# Patient Record
Sex: Male | Born: 1945 | Race: White | Hispanic: No | Marital: Married | Smoking: Never smoker
Health system: Southern US, Community
[De-identification: ages and names within clinical notes are randomized; demographics above are authoritative.]

## PROBLEM LIST (undated history)

## (undated) DIAGNOSIS — C9000 Multiple myeloma not having achieved remission: Secondary | ICD-10-CM

## (undated) DIAGNOSIS — R350 Frequency of micturition: Secondary | ICD-10-CM

## (undated) DIAGNOSIS — D497 Neoplasm of unspecified behavior of endocrine glands and other parts of nervous system: Secondary | ICD-10-CM

## (undated) DIAGNOSIS — E079 Disorder of thyroid, unspecified: Secondary | ICD-10-CM

## (undated) DIAGNOSIS — G4730 Sleep apnea, unspecified: Secondary | ICD-10-CM

## (undated) HISTORY — DX: Neoplasm of unspecified behavior of endocrine glands and other parts of nervous system: D49.7

## (undated) HISTORY — PX: TONSILLECTOMY: SUR1361

## (undated) HISTORY — DX: Multiple myeloma not having achieved remission: C90.00

## (undated) HISTORY — PX: APPENDECTOMY: SHX54

## (undated) HISTORY — DX: Frequency of micturition: R35.0

## (undated) HISTORY — PX: TRANSPHENOIDAL / TRANSNASAL HYPOPHYSECTOMY / RESECTION PITUITARY TUMOR: SUR1382

## (undated) HISTORY — DX: Sleep apnea, unspecified: G47.30

## (undated) HISTORY — DX: Disorder of thyroid, unspecified: E07.9

---

## 2009-03-09 ENCOUNTER — Other Ambulatory Visit: Payer: Self-pay | Admitting: Ophthalmology

## 2009-03-12 ENCOUNTER — Ambulatory Visit: Payer: Self-pay | Admitting: Ophthalmology

## 2012-02-06 HISTORY — PX: EYE SURGERY: SHX253

## 2012-02-06 HISTORY — PX: OTHER SURGICAL HISTORY: SHX169

## 2012-04-15 ENCOUNTER — Ambulatory Visit: Payer: Self-pay | Admitting: Ophthalmology

## 2012-04-15 DIAGNOSIS — I499 Cardiac arrhythmia, unspecified: Secondary | ICD-10-CM

## 2012-04-28 ENCOUNTER — Ambulatory Visit: Payer: Self-pay | Admitting: Ophthalmology

## 2014-01-06 ENCOUNTER — Ambulatory Visit: Payer: Self-pay | Admitting: Internal Medicine

## 2014-05-28 NOTE — Op Note (Signed)
PATIENT NAME:  Ronald Vincent, Ronald Vincent MR#:  [id number] DATE OF BIRTH:  28-Aug-1945  DATE OF SURGERY:  04/28/2012   PREOPERATIVE DIAGNOSIS: Cataract, right eye.   POSTOPERATIVE DIAGNOSIS:  Cataract, right eye.   PROCEDURE PERFORMED:  Extracapsular cataract extraction using phacoemulsification with placement of an Alcon [id number] 19.5 diopter posterior chamber lens, serial #[id number].   SURGEON: Loura Back. Vennesa Bastedo, M.D.   ANESTHESIOLOGIST: Erin Hearing, MD  COMPLICATIONS: None.   ESTIMATED BLOOD LOSS: Less than 1 mL.   ANESTHESIA:  4% lidocaine and 0.75% Marcaine in a 50/50 mixture given as a peribulbar with 10 units/mL of Hylenex added, given as a peribulbar.   DESCRIPTION OF PROCEDURE:  The patient was brought to the operating room and given a peribulbar block.  The patient was then prepped and draped in the usual fashion.  The vertical rectus muscles were imbricated using 5-0 silk sutures.  These sutures were then clamped to the sterile drapes as bridle sutures.  A limbal peritomy was performed extending two clock hours and hemostasis was obtained with cautery.  A partial thickness scleral groove was made at the surgical limbus and dissected anteriorly in a lamellar dissection using an Alcon crescent knife.  The anterior chamber was entered superonasally with a Superblade and through the lamellar dissection with a 2.6 mm keratome.  DisCoVisc was used to replace the aqueous and a continuous tear capsulorrhexis was carried out.  Hydrodissection and hydrodelineation were carried out with balanced salt and a 27 gauge canula.  The nucleus was rotated to confirm the effectiveness of the hydrodissection.    Irrigation/aspiration was used to remove the residual cortex.  DisCoVisc was used to inflate the capsule and the internal incision was enlarged to 3 mm with the crescent knife.  The intraocular lens was folded and inserted into the capsular bag.  Irrigation/aspiration was used to remove the  residual DisCoVisc.  Miostat was injected into the anterior chamber through the paracentesis track to inflate the anterior chamber and induce miosis.  Cefuroxime 0.1 mL was injected via the paracentesis tract.  The wound was checked for leaks and none were found. The conjunctiva was closed with cautery and the bridle sutures were removed.  Two drops of 0.3% Vigamox were placed on the eye.   An eye shield was placed on the eye.  Ultrasound time 59 seconds, with an average power of 22.8%, CDE of 23.25. A suture was placed and an AcrySert delivery system was used instead of a Librarian, academic.    The patient was discharged to the recovery room in good condition.    ____________________________ Loura Back Elnore Cosens, MD sad:dm D: 04/28/2012 14:19:00 ET T: 04/28/2012 15:10:22 ET JOB#: [id number]  cc: Remo Lipps A. Helga Asbury, MD, <Dictator>  Martie Lee MD ELECTRONICALLY SIGNED 05/05/2012 13:40

## 2015-06-21 ENCOUNTER — Ambulatory Visit
Admission: RE | Admit: 2015-06-21 | Discharge: 2015-06-21 | Disposition: A | Discharge status: Home / Self Care | Payer: Commercial Managed Care - PPO | Source: Ambulatory Visit | Attending: Internal Medicine | Admitting: Internal Medicine

## 2015-06-21 ENCOUNTER — Inpatient Hospital Stay: Payer: Commercial Managed Care - PPO | Attending: Internal Medicine | Admitting: Internal Medicine

## 2015-06-21 ENCOUNTER — Inpatient Hospital Stay: Payer: Commercial Managed Care - PPO

## 2015-06-21 ENCOUNTER — Encounter: Payer: Self-pay | Admitting: Internal Medicine

## 2015-06-21 VITALS — BP 122/81 | HR 72 | Temp 98.4°F | Resp 18 | Wt 165.3 lb

## 2015-06-21 DIAGNOSIS — M40202 Unspecified kyphosis, cervical region: Secondary | ICD-10-CM | POA: Insufficient documentation

## 2015-06-21 DIAGNOSIS — D696 Thrombocytopenia, unspecified: Secondary | ICD-10-CM | POA: Insufficient documentation

## 2015-06-21 DIAGNOSIS — G4730 Sleep apnea, unspecified: Secondary | ICD-10-CM | POA: Diagnosis not present

## 2015-06-21 DIAGNOSIS — R5383 Other fatigue: Secondary | ICD-10-CM | POA: Diagnosis not present

## 2015-06-21 DIAGNOSIS — E230 Hypopituitarism: Secondary | ICD-10-CM | POA: Insufficient documentation

## 2015-06-21 DIAGNOSIS — M160 Bilateral primary osteoarthritis of hip: Secondary | ICD-10-CM | POA: Insufficient documentation

## 2015-06-21 DIAGNOSIS — D649 Anemia, unspecified: Secondary | ICD-10-CM | POA: Insufficient documentation

## 2015-06-21 DIAGNOSIS — E039 Hypothyroidism, unspecified: Secondary | ICD-10-CM | POA: Insufficient documentation

## 2015-06-21 DIAGNOSIS — Z87828 Personal history of other (healed) physical injury and trauma: Secondary | ICD-10-CM | POA: Insufficient documentation

## 2015-06-21 DIAGNOSIS — C9000 Multiple myeloma not having achieved remission: Secondary | ICD-10-CM | POA: Diagnosis not present

## 2015-06-21 DIAGNOSIS — R0602 Shortness of breath: Secondary | ICD-10-CM | POA: Insufficient documentation

## 2015-06-21 DIAGNOSIS — Z79899 Other long term (current) drug therapy: Secondary | ICD-10-CM | POA: Insufficient documentation

## 2015-06-21 DIAGNOSIS — R350 Frequency of micturition: Secondary | ICD-10-CM | POA: Diagnosis not present

## 2015-06-21 DIAGNOSIS — D472 Monoclonal gammopathy: Secondary | ICD-10-CM | POA: Diagnosis present

## 2015-06-21 DIAGNOSIS — E2740 Unspecified adrenocortical insufficiency: Secondary | ICD-10-CM | POA: Diagnosis not present

## 2015-06-21 LAB — CBC WITH DIFFERENTIAL/PLATELET
Basophils Absolute: 0.2 10*3/uL — ABNORMAL HIGH (ref 0–0.1)
EOS ABS: 0.2 10*3/uL (ref 0–0.7)
Eosinophils Relative: 2 %
HCT: 38.6 % — ABNORMAL LOW (ref 40.0–52.0)
HEMOGLOBIN: 12.4 g/dL — AB (ref 13.0–18.0)
Lymphocytes Relative: 13 %
Lymphs Abs: 1.2 10*3/uL (ref 1.0–3.6)
MCH: 27.7 pg (ref 26.0–34.0)
MCHC: 32.1 g/dL (ref 32.0–36.0)
MCV: 86.4 fL (ref 80.0–100.0)
Monocytes Absolute: 0.8 10*3/uL (ref 0.2–1.0)
Monocytes Relative: 9 %
Neutro Abs: 6.8 10*3/uL — ABNORMAL HIGH (ref 1.4–6.5)
PLATELETS: 109 10*3/uL — AB (ref 150–440)
RBC: 4.47 MIL/uL (ref 4.40–5.90)
RDW: 25.4 % — ABNORMAL HIGH (ref 11.5–14.5)
WBC: 9.1 10*3/uL (ref 3.8–10.6)

## 2015-06-21 LAB — LACTATE DEHYDROGENASE: LDH: 158 U/L (ref 98–192)

## 2015-06-21 NOTE — Progress Notes (Signed)
Patient here today as new evaluation regarding elevated WBC's.  Multiple Myeloma)  Referred by Dr. Ouida Sills.

## 2015-06-21 NOTE — Progress Notes (Signed)
[facility] CONSULT NOTE  No care team member to display  CHIEF COMPLAINTS/PURPOSE OF CONSULTATION:   # MAY 2017- M- spike 1.8gm/dl [IgGKappa-IFE] [creat-1.0/ca- 8.8]; Hb12/platelets- 115.   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 CBC showed mild anemia hemoglobin 12; platelets 1:15. Patient's iron studies B12 normal limits. Further workup was remarkable for- M protein 1.8 g/dL/ he has been referred to [place] for further evaluation and recommendations.  Patient complains of worsening fatigue in the last 3-4 months. Shortness of breath on exertion. Denies any shortness of breath at rest. Denies any unusual cough. Denies any swelling in the legs or weight loss or tingling and numbness. Denies any chest pain. Occasional leg cramps. Patient is fairly active for his age.   ROS: A complete 10 point review of system is done which is negative except mentioned above in history of present illness  MEDICAL HISTORY:  Past Medical History  Diagnosis Date  . 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 Sleep apnea   . Thyroid dysfunction   . Urinary frequency   . Multiple myeloma ([place])     SURGICAL HISTORY: Past Surgical History  Procedure Laterality Date  . Appendectomy      SOCIAL HISTORY: No smoking or alcohol. [place] here. His daughter is a Marine scientist in the [employer]. He lives with his wife at home. Social History   Social History  . Marital Status: Married    Spouse Name: N/A  . Number of Children: N/A  . Years of Education: N/A   Occupational History  . Not on file.   Social  History Main Topics  . Smoking status: Never Smoker   . Smokeless tobacco: Never Used  . Alcohol Use: No  . Drug Use: No  . Sexual Activity: Not on file   Other Topics Concern  . Not on file   Social History Narrative  . No narrative on file    FAMILY HISTORY:No family history of multiple myeloma. No family history on file.  ALLERGIES:  has No Known Allergies.  MEDICATIONS:  Current Outpatient Prescriptions  Medication Sig Dispense Refill  . alendronate (FOSAMAX) 70 MG tablet Take by mouth.    . B Complex Vitamins (VITAMIN-B COMPLEX) TABS Take by mouth.    . Flaxseed, Linseed, (FLAXSEED OIL) 1000 MG CAPS Take by mouth.    . Glucosamine Sulfate 500 MG TABS Take by mouth.    . hydrocortisone (CORTEF) 10 MG tablet     . levothyroxine (SYNTHROID, LEVOTHROID) 150 MCG tablet     . Multiple Vitamin (MULTI-VITAMINS) TABS Take by mouth.    Marland Kitchen NEEDLE, DISP, 18 G (BD ECLIPSE SHIELDED NEEDLE) 18G X 1-1/2" MISC     . Syringe, Disposable, (2-3CC SYRINGE) 3 ML MISC     . SYRINGE-NEEDLE, DISP, 3 ML (B-D 3CC LUER-LOK SYR 18GX1-1/2) 18G X 1-1/2" 3 ML MISC     . testosterone cypionate (DEPOTESTOSTERONE CYPIONATE) 200 MG/ML injection     . B-D 3CC LUER-LOK SYR 18GX1-1/2 18G X 1-1/2" 3 ML MISC USE 1 SYRINGE ONCE EVERY 2 WEEKS  2  . Cyanocobalamin (B-12) 2500 MCG TABS Take  by mouth.     No current facility-administered medications for this visit.      Marland Kitchen  PHYSICAL EXAMINATION: ECOG PERFORMANCE STATUS: 0 - Asymptomatic  Filed Vitals:   06/21/15 1107  BP: 122/81  Pulse: 72  Temp: 98.4 F (36.9 C)  Resp: 18   Filed Weights   06/21/15 1107  Weight: 165 lb 5.5 oz (75 kg)    GENERAL: Well-nourished well-developed; Alert, no distress and comfortable. Alone.  EYES: no pallor or icterus OROPHARYNX: no thrush or ulceration; good dentition  NECK: supple, no masses felt; flexion deformity of the neck LYMPH:  no palpable lymphadenopathy in the cervical, axillary or inguinal regions LUNGS:  clear to auscultation and  No wheeze or crackles HEART/CVS: regular rate & rhythm and no murmurs; No lower extremity edema ABDOMEN: abdomen soft, non-tender and normal bowel sounds Musculoskeletal:no cyanosis of digits and no clubbing  PSYCH: alert & oriented x 3 with fluent speech NEURO: no focal motor/sensory deficits SKIN:  no rashes or significant lesions  LABORATORY DATA:  I have reviewed the data as listed No results found for: WBC, HGB, HCT, MCV, PLT No results for input(s): NA, K, CL, CO2, GLUCOSE, BUN, CREATININE, CALCIUM, GFRNONAA, GFRAA, PROT, ALBUMIN, AST, ALT, ALKPHOS, BILITOT, BILIDIR, IBILI in the last 8760 hours.  RADIOGRAPHIC STUDIES: I have personally reviewed the radiological images as listed and agreed with the findings in the report. No results found.  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 Otherwise no renal insufficiency/hypercalcemia. Otherwise clinically doing well- except for fatigue.  I had a long discussion the patient and family regarding- pathophysiology of multiple myeloma/monoclonal gammopathy. I would recommend rechecking CBC And lambda light chain ratio; metastatic bone/skeletal survey. I also discussed regarding bone marrow biopsy- which will likely need to be done to evaluate for monoclonal plasma cells. The timing of the bone marrow will depend upon patient's labs from today- if his kappa/lambda light chain ratio- is extremely abnormal/orders of the labs are abnormal.   # Mild thrombocytopenia platelet count 1:15- unclear etiology question related to M protein versus other causes.   # If patient's labs the steady- I would recommend repeating the labs in approximately 3 months/ reevaluate for bone marrow biopsy at that time.  # Fatigue unclear etiology- patient is being worked up for cardiac causes; low testosterone- on testosterone  supplementation.Defer to endocrinology.   Thank you Dr. Ouida Sills for allowing me to participate in the care of your pleasant patient. Please do not hesitate to contact me with questions or concerns in the interim.     Cammie Sickle, MD 06/21/2015 12:05 PM

## 2015-06-22 ENCOUNTER — Telehealth: Payer: Self-pay | Admitting: Internal Medicine

## 2015-06-22 LAB — KAPPA/LAMBDA LIGHT CHAINS
KAPPA FREE LGHT CHN: 113.15 mg/L — AB (ref 3.30–19.40)
Kappa, lambda light chain ratio: 17.22 — ABNORMAL HIGH (ref 0.26–1.65)
Lambda free light chains: 6.57 mg/L (ref 5.71–26.30)

## 2015-06-22 NOTE — Telephone Encounter (Signed)
Left a message for the patient to call us back. I would recommend bone marrow biopsy for further evaluation of his M protein/ elevated kappa- Lambda light chain ratio.   [place]- we saw him in [place] yesterday.

## 2015-06-23 ENCOUNTER — Other Ambulatory Visit: Payer: Self-pay | Admitting: Internal Medicine

## 2015-06-23 ENCOUNTER — Other Ambulatory Visit: Payer: Self-pay | Admitting: *Deleted

## 2015-06-23 DIAGNOSIS — D472 Monoclonal gammopathy: Secondary | ICD-10-CM

## 2015-06-23 NOTE — Telephone Encounter (Signed)
Pt returned Dr. Aletha Halim phone call. He is agreeable for bone marrow biopsy. Order placed in chl and msg sent to scheduling to arrange. Pt will be contacted with an bone marrow biopsy appointment.  Md will see patient back a week s/p bone marrow bx for results.

## 2015-06-24 NOTE — Telephone Encounter (Signed)
Pt will go to [place] to have bx performed. Unable to accommodate pt at [facility] due to ct image machine down and in maintenance.

## 2015-06-28 ENCOUNTER — Other Ambulatory Visit: Payer: Self-pay | Admitting: Physician Assistant

## 2015-06-28 ENCOUNTER — Other Ambulatory Visit: Payer: Self-pay | Admitting: Radiology

## 2015-06-28 ENCOUNTER — Other Ambulatory Visit: Payer: Self-pay | Admitting: General Surgery

## 2015-06-29 ENCOUNTER — Ambulatory Visit (HOSPITAL_COMMUNITY)
Admission: RE | Admit: 2015-06-29 | Discharge: 2015-06-29 | Disposition: A | Discharge status: Home / Self Care | Payer: Commercial Managed Care - PPO | Source: Ambulatory Visit | Attending: Internal Medicine | Admitting: Internal Medicine

## 2015-06-29 ENCOUNTER — Encounter (HOSPITAL_COMMUNITY): Payer: Self-pay

## 2015-06-29 DIAGNOSIS — E079 Disorder of thyroid, unspecified: Secondary | ICD-10-CM | POA: Insufficient documentation

## 2015-06-29 DIAGNOSIS — Z7983 Long term (current) use of bisphosphonates: Secondary | ICD-10-CM | POA: Insufficient documentation

## 2015-06-29 DIAGNOSIS — D649 Anemia, unspecified: Secondary | ICD-10-CM | POA: Insufficient documentation

## 2015-06-29 DIAGNOSIS — D696 Thrombocytopenia, unspecified: Secondary | ICD-10-CM | POA: Insufficient documentation

## 2015-06-29 DIAGNOSIS — D472 Monoclonal gammopathy: Secondary | ICD-10-CM | POA: Diagnosis not present

## 2015-06-29 DIAGNOSIS — Z79899 Other long term (current) drug therapy: Secondary | ICD-10-CM | POA: Diagnosis not present

## 2015-06-29 DIAGNOSIS — G4730 Sleep apnea, unspecified: Secondary | ICD-10-CM | POA: Diagnosis not present

## 2015-06-29 DIAGNOSIS — E230 Hypopituitarism: Secondary | ICD-10-CM | POA: Diagnosis not present

## 2015-06-29 HISTORY — PX: BONE MARROW BIOPSY: SHX199

## 2015-06-29 LAB — PROTIME-INR
INR: 1.08 (ref 0.00–1.49)
PROTHROMBIN TIME: 14.2 s (ref 11.6–15.2)

## 2015-06-29 LAB — CBC
HCT: 37.5 % — ABNORMAL LOW (ref 39.0–52.0)
HEMOGLOBIN: 12.6 g/dL — AB (ref 13.0–17.0)
MCH: 27.9 pg (ref 26.0–34.0)
MCHC: 33.6 g/dL (ref 30.0–36.0)
MCV: 83.1 fL (ref 78.0–100.0)
PLATELETS: 101 10*3/uL — AB (ref 150–400)
RBC: 4.51 MIL/uL (ref 4.22–5.81)
RDW: 23.1 % — ABNORMAL HIGH (ref 11.5–15.5)
WBC: 7.3 10*3/uL (ref 4.0–10.5)

## 2015-06-29 LAB — APTT: aPTT: 26 seconds (ref 24–37)

## 2015-06-29 LAB — BONE MARROW EXAM

## 2015-06-29 MED ORDER — MIDAZOLAM HCL 2 MG/2ML IJ SOLN
INTRAMUSCULAR | Status: AC
  Filled 2015-06-29: qty 4

## 2015-06-29 MED ORDER — HYDROCODONE-ACETAMINOPHEN 5-325 MG PO TABS
1.0000 | ORAL_TABLET | ORAL | Status: DC | PRN
  Filled 2015-06-29: qty 2

## 2015-06-29 MED ORDER — FENTANYL CITRATE (PF) 100 MCG/2ML IJ SOLN
INTRAMUSCULAR | Status: AC
  Filled 2015-06-29: qty 2

## 2015-06-29 MED ORDER — FENTANYL CITRATE (PF) 100 MCG/2ML IJ SOLN
INTRAMUSCULAR | Status: AC | PRN
  Administered 2015-06-29 (×2): 50 ug via INTRAVENOUS

## 2015-06-29 MED ORDER — SODIUM CHLORIDE 0.9 % IV SOLN
INTRAVENOUS | Status: DC
  Administered 2015-06-29: 07:00:00 via INTRAVENOUS

## 2015-06-29 MED ORDER — MIDAZOLAM HCL 2 MG/2ML IJ SOLN
INTRAMUSCULAR | Status: AC | PRN
  Administered 2015-06-29 (×2): 1 mg via INTRAVENOUS

## 2015-06-29 NOTE — Procedures (Signed)
Interventional Radiology Procedure Note  Procedure: CT guided aspirate and core biopsy of right iliac bone Complications: None Recommendations: - Bedrest supine x 1 hrs - Hydrocodone PRN  Pain - Follow biopsy results  Signed,  Heath K. McCullough, MD   

## 2015-06-29 NOTE — Discharge Instructions (Signed)
Bone Marrow Aspiration and Bone Marrow Biopsy, Care After Refer to this sheet in the next few weeks. These instructions provide you with information about caring for yourself after your procedure. Your health care provider may also give you more specific instructions. Your treatment has been planned according to current medical practices, but problems sometimes occur. Call your health care provider if you have any problems or questions after your procedure. WHAT TO EXPECT AFTER THE PROCEDURE After your procedure, it is common to have:  Soreness or tenderness around the puncture site.  Bruising. HOME CARE INSTRUCTIONS  Take medicines only as directed by your health care provider.  Follow your health care provider's instructions about:  Puncture site care.  Bandage (dressing) changes and removal.  Bathe and shower as directed by your health care provider.  Check your puncture site every day for signs of infection. Watch for:  Redness, swelling, or pain.  Fluid, blood, or pus.  Return to your normal activities as directed by your health care provider.  Keep all follow-up visits as directed by your health care provider. This is important. SEEK MEDICAL CARE IF:  You have a fever.  You have uncontrollable bleeding.  You have redness, swelling, or pain at the site of your puncture.  You have fluid, blood, or pus coming from your puncture site.   This information is not intended to replace advice given to you by your health care provider. Make sure you discuss any questions you have with your health care provider.   Document Released: 08/11/2004 Document Revised: 06/08/2014 Document Reviewed: 01/13/2014 Elsevier Interactive Patient Education 2016 Elsevier [employer] Moderate Conscious Sedation, Adult, Care After Refer to this sheet in the next few weeks. These instructions provide you with information on caring for yourself after your procedure. Your health care provider may also give  you more specific instructions. Your treatment has been planned according to current medical practices, but problems sometimes occur. Call your health care provider if you have any problems or questions after your procedure. WHAT TO EXPECT AFTER THE PROCEDURE  After your procedure:  You may feel sleepy, clumsy, and have poor balance for several hours.  Vomiting may occur if you eat too soon after the procedure. HOME CARE INSTRUCTIONS  Do not participate in any activities where you could become injured for at least 24 hours. Do not:  Drive.  Swim.  Ride a bicycle.  Operate heavy machinery.  Cook.  Use power tools.  Climb ladders.  Work from a high place.  Do not make important decisions or sign legal documents until you are improved.  If you vomit, drink water, juice, or soup when you can drink without vomiting. Make sure you have little or no nausea before eating solid foods.  Only take over-the-counter or prescription medicines for pain, discomfort, or fever as directed by your health care provider.  Make sure you and your family fully understand everything about the medicines given to you, including what side effects may occur.  You should not drink alcohol, take sleeping pills, or take medicines that cause drowsiness for at least 24 hours.  If you smoke, do not smoke without supervision.  If you are feeling better, you may resume normal activities 24 hours after you were sedated.  Keep all appointments with your health care provider. SEEK MEDICAL CARE IF:  Your skin is pale or bluish in color.  You continue to feel nauseous or vomit.  Your pain is getting worse and is not helped by medicine.  You have bleeding or swelling.  You are still sleepy or feeling clumsy after 24 hours. SEEK IMMEDIATE MEDICAL CARE IF:  You develop a rash.  You have difficulty breathing.  You develop any type of allergic problem.  You have a fever. MAKE SURE YOU:  Understand  these instructions.  Will watch your condition.  Will get help right away if you are not doing well or get worse.   This information is not intended to replace advice given to you by your health care provider. Make sure you discuss any questions you have with your health care provider.   Document Released: 11/12/2012 Document Revised: 02/12/2014 Document Reviewed: 11/12/2012 Elsevier Interactive Patient Education [employer].

## 2015-06-29 NOTE — Consult Note (Signed)
Chief Complaint: Patient was seen in consultation today for  CT guided bone marrow biopsy  Referring Physician(s): Cammie Sickle  Supervising Physician: Jacqulynn Cadet  Patient Status:  Out-pt  History of Present Illness: Ronald Vincent is a 70 y.o. 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 He presents today for CT-guided bone marrow biopsy for further evaluation.  Past Medical History  Diagnosis Date  . 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 Sleep apnea   . Thyroid dysfunction   . Urinary frequency   . Multiple myeloma [facility])     Past Surgical History  Procedure Laterality Date  . Appendectomy    . Transphenoidal / transnasal hypophysectomy / resection pituitary tumor  [id number]  . Cataracts Right 2014  . Eye surgery Bilateral 2014    to correct double vision  . Tonsillectomy      as a child    Allergies: Review of patient's allergies indicates no known allergies.  Medications: Prior to Admission medications   Medication Sig Start Date End Date Taking? Authorizing Provider  B Complex Vitamins (VITAMIN-B COMPLEX) TABS Take by mouth. 09/27/09  Yes Historical Provider, MD  Cyanocobalamin (B-12) 2500 MCG TABS Take by mouth.   Yes Historical Provider, MD  Flaxseed, Linseed, (FLAXSEED OIL) 1000 MG CAPS Take by mouth. 09/27/09  Yes Historical Provider, MD  Glucosamine Sulfate 500 MG TABS Take by mouth. 09/27/09  Yes Historical Provider, MD  hydrocortisone (CORTEF) 10 MG tablet  06/08/15  Yes Historical Provider, MD  levothyroxine (SYNTHROID, LEVOTHROID) 150 MCG tablet  06/13/15  Yes Historical Provider, MD  Multiple Vitamin (MULTI-VITAMINS) TABS Take by mouth. 09/27/09  Yes Historical Provider, MD  alendronate (FOSAMAX) 70 MG tablet Take by mouth. 12/14/14 12/14/15  Historical Provider, MD  B-D 3CC LUER-LOK SYR 18GX1-1/2 18G X 1-1/2" 3 ML MISC USE 1 SYRINGE  ONCE EVERY 2 WEEKS 04/07/15   Historical Provider, MD  NEEDLE, DISP, 18 G (BD ECLIPSE SHIELDED NEEDLE) 18G X 1-1/2" [place]  03/22/14   Historical Provider, MD  Syringe, Disposable, (2-3CC SYRINGE) 3 ML MISC  10/15/14   Historical Provider, MD  SYRINGE-NEEDLE, DISP, 3 ML (B-D 3CC LUER-LOK SYR 18GX1-1/2) 18G X 1-1/2" 3 ML MISC  02/16/15   Historical Provider, MD  testosterone cypionate (DEPOTESTOSTERONE CYPIONATE) 200 MG/ML injection  12/14/14   Historical Provider, MD     History reviewed. No pertinent family history.  Social History   Social History  . Marital Status: Married    Spouse Name: N/A  . Number of Children: N/A  . Years of Education: N/A   Social History Main Topics  . Smoking status: Never Smoker   . Smokeless tobacco: Never Used  . Alcohol Use: No  . Drug Use: No  . Sexual Activity: Not Asked   Other Topics Concern  . None   Social History Narrative      Review of Systems  Constitutional: Positive for fatigue. Negative for fever and chills.  Respiratory: Negative for cough.        Some dyspnea with exertion  Cardiovascular: Negative for chest pain.  Gastrointestinal: Negative for nausea, vomiting, abdominal pain and blood in stool.  Genitourinary: Negative for dysuria and hematuria.  Musculoskeletal: Negative for back pain.  Neurological: Negative for headaches.    Vital Signs: BP 124/84 mmHg  Pulse 75  Temp(Src) 97.7 F (36.5 C) (Oral)  Resp 18  Ht 5' 5"  (1.651 m)  Wt 165 lb 5.5 oz (74.999 kg)  BMI 27.51 kg/m2  SpO2 100%  Physical Exam  Constitutional: He is oriented to person, place, and time. He appears well-developed and well-nourished.  Cardiovascular: Normal rate and regular rhythm.   Pulmonary/Chest: Effort normal and breath sounds normal.  Abdominal: Soft. Bowel sounds are normal. There is no tenderness.  Musculoskeletal: Normal range of motion. He exhibits no edema.  Neurological: He is alert and oriented to person, place, and time.     Mallampati Score:     Imaging: Dg Bone Survey Met  06/21/2015  CLINICAL DATA:  History of multiple myeloma, history prior neck fracture EXAM: METASTATIC BONE SURVEY COMPARISON:  None. FINDINGS: The lateral view the skull shows a in and apparent postoperative linear defect within the frontal -temporal calvarium with some widening of the bone at the surgical site. However no radiolucent lesion is seen to indicate multiple myeloma. The cervical vertebrae are very difficult to evaluate with extreme kyphosis. There is anterolisthesis of C3 on C4 by approximately 6 mm with slight anterolisthesis of C4 on C5 by approximately 3 mm. There appears to be significant degenerative change from C3-C7 with partial compression of C4, C5, and C6 with anterior osteophyte formation. No radiolucent lesion is seen with certainty. The thoracic vertebrae are normal alignment. No compression deformity is seen. Mild anterior osteophyte formation is noted. There is minimal curvature of the lumbar vertebrae convex to the right. The bones are diffusely osteopenic. There is degenerative disc disease primarily in the lower thoracic and upper lumbar spine but no compression deformity is seen. No radiolucent lesion is noted. Probable gallstones are noted within the right upper quadrant. Views the right shoulder, right humerus, and right forearm show no radiolucent lesion. There is degenerative change in the right shoulder with some loss of joint space and acromial spurring. Also no acute rib abnormality is seen. Views of the left shoulder, left humerus, and left forearm show no radiolucent lesion. Degenerative changes are present left shoulder. A frontal chest x-ray shows no active infiltrate or effusion. Minimally prominent interstitial markings may be present. The heart is within upper limits of normal. No acute bony abnormality is seen. A view of the pelvis shows the bones to be osteopenic. No radiolucent lesion is seen. Only mild  degenerative joint disease of the hips is present. Views of the right femur and right tibia and fibula show no radiolucent lesion. Views of the left femur, left tibia and fibula show no radiolucent abnormality IMPRESSION: 1. No radiolucent lesions are seen to indicate myelomatous deposits. 2. Probable postoperative linear defect in the frontotemporal calvarium as noted above. 3. 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 4. Probable gallstones in the right upper quadrant. 5. No active lung disease. Question mild chronic interstitial change. Electronically Signed   By: Ivar Drape M.D.   On: 06/21/2015 14:16    Labs:  CBC:  Recent Labs  06/21/15 1158 06/29/15 0658  WBC 9.1 7.3  HGB 12.4* 12.6*  HCT 38.6* 37.5*  PLT 109* 101*    COAGS:  Recent Labs  06/29/15 0658  INR 1.08  APTT 26    BMP: No results for input(s): NA, K, CL, CO2, GLUCOSE, BUN, CALCIUM, CREATININE, GFRNONAA, GFRAA in the last 8760 hours.  Invalid input(s): CMP  LIVER FUNCTION TESTS: No results for input(s): BILITOT, AST, ALT, ALKPHOS, PROT, ALBUMIN in the last 8760 hours.  TUMOR  MARKERS: No results for input(s): AFPTM, CEA, CA199, CHROMGRNA in the last 8760 hours.  Assessment and Plan: 70 y.o. 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 He presents today for CT-guided bone marrow biopsy for further evaluation.Risks and benefits discussed with the patient/wife including, but not limited to bleeding, infection, damage to adjacent structures or low yield requiring additional tests.All of the patient's questions were answered, patient is agreeable to proceed.Consent signed and in chart.     Thank you for this interesting consult.  I greatly enjoyed meeting BRIA PORTALES and  look forward to participating in their care.  A copy of this report was sent to the requesting provider on this date.  Electronically Signed: D. Rowe Robert 06/29/2015, 8:28 AM   I spent a total of 20 minutes in face to face in clinical consultation, greater than 50% of which was counseling/coordinating care for CT-guided bone marrow biopsy

## 2015-07-01 ENCOUNTER — Other Ambulatory Visit: Payer: Self-pay | Admitting: Internal Medicine

## 2015-07-05 ENCOUNTER — Encounter: Payer: Self-pay | Admitting: Internal Medicine

## 2015-07-05 ENCOUNTER — Inpatient Hospital Stay (HOSPITAL_BASED_OUTPATIENT_CLINIC_OR_DEPARTMENT_OTHER): Payer: Commercial Managed Care - PPO | Admitting: Internal Medicine

## 2015-07-05 VITALS — BP 117/80 | HR 66 | Temp 98.3°F | Resp 18 | Wt 164.7 lb

## 2015-07-05 DIAGNOSIS — C9000 Multiple myeloma not having achieved remission: Secondary | ICD-10-CM | POA: Insufficient documentation

## 2015-07-05 DIAGNOSIS — Z79899 Other long term (current) drug therapy: Secondary | ICD-10-CM

## 2015-07-05 DIAGNOSIS — D696 Thrombocytopenia, unspecified: Secondary | ICD-10-CM | POA: Diagnosis not present

## 2015-07-05 DIAGNOSIS — M160 Bilateral primary osteoarthritis of hip: Secondary | ICD-10-CM

## 2015-07-05 DIAGNOSIS — Z87828 Personal history of other (healed) physical injury and trauma: Secondary | ICD-10-CM

## 2015-07-05 DIAGNOSIS — E039 Hypothyroidism, unspecified: Secondary | ICD-10-CM

## 2015-07-05 DIAGNOSIS — D472 Monoclonal gammopathy: Secondary | ICD-10-CM | POA: Diagnosis not present

## 2015-07-05 DIAGNOSIS — R5383 Other fatigue: Secondary | ICD-10-CM

## 2015-07-05 DIAGNOSIS — R0602 Shortness of breath: Secondary | ICD-10-CM

## 2015-07-05 DIAGNOSIS — G4730 Sleep apnea, unspecified: Secondary | ICD-10-CM

## 2015-07-05 DIAGNOSIS — E230 Hypopituitarism: Secondary | ICD-10-CM | POA: Diagnosis not present

## 2015-07-05 DIAGNOSIS — E2740 Unspecified adrenocortical insufficiency: Secondary | ICD-10-CM

## 2015-07-05 DIAGNOSIS — D649 Anemia, unspecified: Secondary | ICD-10-CM

## 2015-07-05 DIAGNOSIS — R350 Frequency of micturition: Secondary | ICD-10-CM

## 2015-07-05 NOTE — Progress Notes (Signed)
Patient here today for bone marrow biopsy results. 

## 2015-07-05 NOTE — Patient Instructions (Signed)
Multiple Myeloma Multiple myeloma is a form of cancer that results from the uncontrolled growth of abnormal plasma cells. Plasma cells are a type of white blood cell produced in the soft tissue inside bones (bone marrow). These are cells in your blood that normally help you fight infection. They are part of your body's defense system (immune system). Plasma cells that become cancerous will grow out of control. As a result, they interfere with normal blood cells and many important functions that normal cells perform in your body. With multiple myeloma, the abnormal plasma cells cause multiple tumors to form. Multiple myeloma damages your bones and causes other health problems because of its effect on blood cells. The disease progresses and reduces your ability to fight off infections. CAUSES The cause of multiple myeloma is not known. RISK FACTORS Risk factors include:  Being older than 3.  Being African American.  Having a family history of the disease. SIGNS AND SYMPTOMS Signs and symptoms of multiple myeloma may include:  Bone pain, especially in the back, ribs, and hips.  Broken bones (fractures).  Low blood counts, including reduced red blood cells (anemia), reduced white blood cells (leukopenia), and reduced platelets.  Fatigue.  Weakness.  Infections.  Bleeding, such as bleeding from the nose or gums, or increased bleeding from a scrape or cut.  High blood calcium levels.  Increased urination.  Confusion. DIAGNOSIS Your health care provider will do a physical exam and take your medical history. Tests will be done to help confirm the diagnosis. Tests may include:  Blood tests.  Urine tests.  X-rays.  MRI.  Bone marrow biopsy. In this test, a sample of marrow is removed from one of your bones. The sample is viewed under a microscope to check for abnormal plasma cells. TREATMENT There is no cure for multiple myeloma. Treatment options may vary depending on how much  the disease has advanced. Possible treatment options may include:  Medicines that kill cancer cells (chemotherapy).  Medicines that help prevent bone damage (bisphosphonates).  Radiation therapy. High-energy rays are used to kill cancer cells.  Surgery. This may be done to repair damage to bone.  Targeted drug therapy. These medicines block the growth and spread of cancer cells.  Immunotherapy. This is also called biologic therapy. It involves the use of medicines to strengthen the ability of your immune system to fight cancer cells.  Stem cell transplant. Healthy stem cells are infused into your body. These stem cells produce new blood cells to replace those killed by the disease or by other treatments. The healthy cells that are transplanted may be your own or may come from another person.  Plasmapheresis. This is a procedure used to remove plasma cells from your blood.  Other medicines to treat problems such as infections or pain. HOME CARE INSTRUCTIONS  Take medicines only as directed by your health care provider.  Drink enough fluid to keep your urine clear or pale yellow.  Eat a well-balanced diet. Work with a dietitian to make sure you are getting the nutrition you need.  Take vitamins or dietary supplements as directed by your health care provider or dietitian.  Stay active. Talk to your health care provider about what types of exercises and activities are safe for you.  Avoid activities that cause increased pain.  Do not lift anything heavier than 10 lb (4.5 kg).  Consider joining a support group or seeking counseling to help you cope with the stress of having multiple myeloma.  Keep all  follow-up visits as directed by your health care provider. This is important. SEEK MEDICAL CARE IF:  Your pain is not controlled with medicine or is getting worse.  You have a fever.  You have swollen legs.  You have weakness or dizziness.  You have unexplained weight  loss.  You have unexplained bleeding or bruising.  You have a cough or cold symptoms.  You feel depressed.  You have changes in urination or bowel movements. SEEK IMMEDIATE MEDICAL CARE IF:  You have sudden severe pain, especially back pain.  You have numbness or weakness in your arms, hands, legs, or feet.  You have confusion.  You have weakness on one side of your body.  You have slurred speech.  You have trouble staying awake.  You have shortness of breath.  You have blood in your stool or urine.  You vomit or cough up blood.   This information is not intended to replace advice given to you by your health care provider. Make sure you discuss any questions you have with your health care provider.   Document Released: 10/17/2000 Document Revised: 02/12/2014 Document Reviewed: 08/25/2013 Elsevier Interactive Patient Education [employer].

## 2015-07-05 NOTE — Progress Notes (Signed)
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 FISH- pending.   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  male pleasant Caucasian patient noted to have elevated M protein/thrombocytopenia-summarized above is here to review the results of the bone marrow biopsy.  Bone marrow biopsy procedure was uneventful. No bleeding. No significant pain currently.  Patient continues to have chronic fatigue; not significantly improved. Chronic mild Shortness of breath on exertion. Denies any shortness of breath at rest. Denies any unusual cough. Denies any swelling in the legs or weight loss or tingling and numbness. Denies any chest pain.Patient is fairly active for his age.  ROS: A complete 10 point review of system is done which is negative except mentioned above in history of present illness  MEDICAL HISTORY:  Past Medical History  Diagnosis Date  . 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 Sleep apnea   . Thyroid dysfunction   . Urinary frequency   . Multiple myeloma ([place])     smuldering multiple myeloma    SURGICAL HISTORY: Past Surgical History  Procedure Laterality Date  . Appendectomy    . Transphenoidal / transnasal hypophysectomy / resection pituitary tumor  [id number],2376  . Cataracts Right 2014  . Eye surgery Bilateral 2014    to correct double vision  . Tonsillectomy      as a child  . Bone marrow biopsy  06/29/15    SOCIAL HISTORY: No  smoking or alcohol. [place] here. His daughter is a Marine scientist in the [employer]. He lives with his wife at home. Social History   Social History  . Marital Status: Married    Spouse Name: N/A  . Number of Children: N/A  . Years of Education: N/A   Occupational History  . Not on file.   Social History Main Topics  . Smoking status: Never Smoker   . Smokeless tobacco: Never Used  . Alcohol Use: No  . Drug Use: No  . Sexual Activity: Not on file   Other Topics Concern  . Not on file   Social History Narrative    FAMILY HISTORY:No family history of multiple myeloma. No family history on file.  ALLERGIES:  has No Known Allergies.  MEDICATIONS:  Current Outpatient Prescriptions  Medication Sig Dispense Refill  . alendronate (FOSAMAX) 70 MG tablet Take by mouth.    . B Complex Vitamins (VITAMIN-B COMPLEX) TABS Take by mouth.    . B-D 3CC LUER-LOK SYR 18GX1-1/2 18G X 1-1/2" 3 ML MISC USE 1 SYRINGE ONCE EVERY 2 WEEKS  2  . Cyanocobalamin (B-12) 2500 MCG TABS Take by mouth.    . Flaxseed, Linseed, (FLAXSEED OIL) 1000 MG CAPS Take by mouth.    . Glucosamine Sulfate 500 MG TABS Take by mouth.    . hydrocortisone (CORTEF) 10 MG tablet     . levothyroxine (SYNTHROID, LEVOTHROID) 150 MCG tablet     . Multiple Vitamin (MULTI-VITAMINS) TABS Take by mouth.    Marland Kitchen  NEEDLE, DISP, 18 G (BD ECLIPSE SHIELDED NEEDLE) 18G X 1-1/2" MISC     . Syringe, Disposable, (2-3CC SYRINGE) 3 ML MISC     . SYRINGE-NEEDLE, DISP, 3 ML (B-D 3CC LUER-LOK SYR 18GX1-1/2) 18G X 1-1/2" 3 ML MISC     . testosterone cypionate (DEPOTESTOSTERONE CYPIONATE) 200 MG/ML injection      No current facility-administered medications for this visit.      Marland Kitchen  PHYSICAL EXAMINATION: ECOG PERFORMANCE STATUS: 0 - Asymptomatic  Filed Vitals:   07/05/15 1147  BP: 117/80  Pulse: 66  Temp: 98.3 F (36.8 C)  Resp: 18   Filed Weights   07/05/15 1147  Weight: 164 lb 10.9 oz (74.7 kg)     GENERAL: Well-nourished well-developed; Alert, no distress and comfortable. Accompanied by his wife. EYES: no pallor or icterus OROPHARYNX: no thrush or ulceration; good dentition  NECK: supple, no masses felt; flexion deformity of the neck LYMPH:  no palpable lymphadenopathy in the cervical, axillary or inguinal regions LUNGS: clear to auscultation and  No wheeze or crackles HEART/CVS: regular rate & rhythm and no murmurs; No lower extremity edema ABDOMEN: abdomen soft, non-tender and normal bowel sounds Musculoskeletal:no cyanosis of digits and no clubbing  PSYCH: alert & oriented x 3 with fluent speech NEURO: no focal motor/sensory deficits SKIN:  no rashes or significant lesions  LABORATORY DATA:  I have reviewed the data as listed Lab Results  Component Value Date   WBC 7.3 06/29/2015   HGB 12.6* 06/29/2015   HCT 37.5* 06/29/2015   MCV 83.1 06/29/2015   PLT 101* 06/29/2015   No results for input(s): NA, K, CL, CO2, GLUCOSE, BUN, CREATININE, CALCIUM, GFRNONAA, GFRAA, PROT, ALBUMIN, AST, ALT, ALKPHOS, BILITOT, BILIDIR, IBILI in the last 8760 hours.  RADIOGRAPHIC STUDIES: I have personally reviewed the radiological images as listed and agreed with the findings in the report. Ct Biopsy  06/29/2015  INDICATION: 71 year old male with anemia, monoclonal gammopathy and thrombocytopenia. Evaluate for multiple myeloma. EXAM: CT GUIDED BONE MARROW ASPIRATION AND CORE BIOPSY Interventional Radiologist:  Criselda Peaches, MD MEDICATIONS: None. ANESTHESIA/SEDATION: Moderate (conscious) sedation was employed during this procedure. A total of 2 milligrams versed and 100 micrograms fentanyl were administered intravenously. The patient's level of consciousness and vital signs were monitored continuously by radiology nursing throughout the procedure under my direct supervision. Total monitored sedation time: 10 minutes FLUOROSCOPY TIME:  None COMPLICATIONS: None immediate. Estimated blood  loss: <25 mL PROCEDURE: Informed written consent was obtained from the patient after a thorough discussion of the procedural risks, benefits and alternatives. All questions were addressed. Maximal Sterile Barrier Technique was utilized including caps, mask, sterile gowns, sterile gloves, sterile drape, hand hygiene and skin antiseptic. A timeout was performed prior to the initiation of the procedure. The patient was positioned prone and non-contrast localization CT was performed of the pelvis to demonstrate the iliac marrow spaces. Maximal barrier sterile technique utilized including caps, mask, sterile gowns, sterile gloves, large sterile drape, hand hygiene, and betadine prep. Under sterile conditions and local anesthesia, an 11 gauge coaxial bone biopsy needle was advanced into the right iliac marrow space. Needle position was confirmed with CT imaging. Initially, bone marrow aspiration was performed. Next, the 11 gauge outer cannula was utilized to obtain a right iliac bone marrow core biopsy. Needle was removed. Hemostasis was obtained with compression. The patient tolerated the procedure well. Samples were prepared with the cytotechnologist. IMPRESSION: Technically successful CT-guided bone marrow biopsy of the right iliac bone. Signed, Myrle Sheng  K. Laurence Ferrari, MD [facility] Electronically Signed   By: Jacqulynn Cadet M.D.   On: 06/29/2015 09:53   Dg Bone Survey Met  06/21/2015  CLINICAL DATA:  History of multiple myeloma, history prior neck fracture EXAM: METASTATIC BONE SURVEY COMPARISON:  None. FINDINGS: The lateral view the skull shows a in and apparent postoperative linear defect within the frontal -temporal calvarium with some widening of the bone at the surgical site. However no radiolucent lesion is seen to indicate multiple myeloma. The cervical vertebrae are very difficult to evaluate with extreme kyphosis. There is anterolisthesis of C3 on  C4 by approximately 6 mm with slight anterolisthesis of C4 on C5 by approximately 3 mm. There appears to be significant degenerative change from C3-C7 with partial compression of C4, C5, and C6 with anterior osteophyte formation. No radiolucent lesion is seen with certainty. The thoracic vertebrae are normal alignment. No compression deformity is seen. Mild anterior osteophyte formation is noted. There is minimal curvature of the lumbar vertebrae convex to the right. The bones are diffusely osteopenic. There is degenerative disc disease primarily in the lower thoracic and upper lumbar spine but no compression deformity is seen. No radiolucent lesion is noted. Probable gallstones are noted within the right upper quadrant. Views the right shoulder, right humerus, and right forearm show no radiolucent lesion. There is degenerative change in the right shoulder with some loss of joint space and acromial spurring. Also no acute rib abnormality is seen. Views of the left shoulder, left humerus, and left forearm show no radiolucent lesion. Degenerative changes are present left shoulder. A frontal chest x-ray shows no active infiltrate or effusion. Minimally prominent interstitial markings may be present. The heart is within upper limits of normal. No acute bony abnormality is seen. A view of the pelvis shows the bones to be osteopenic. No radiolucent lesion is seen. Only mild degenerative joint disease of the hips is present. Views of the right femur and right tibia and fibula show no radiolucent lesion. Views of the left femur, left tibia and fibula show no radiolucent abnormality IMPRESSION: 1. No radiolucent lesions are seen to indicate myelomatous deposits. 2. Probable postoperative linear defect in the frontotemporal calvarium as noted above. 3. 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 4. Probable  gallstones in the right upper quadrant. 5. No active lung disease. Question mild chronic interstitial change. Electronically Signed   By: Ivar Drape M.D.   On: 06/21/2015 14:16    ASSESSMENT & PLAN:   # Smoldering MULTIPLE MYELOMA- M spike 1.8 g/dL/ IgG kappa light- patient has mild anemia hemoglobin 12 platelets 115; no renal insufficiency/hypercalcemia. Skeletal survey negative for any lytic lesions. Patient is asymptomatic except for chronic fatigue; I would recommend surveillance at this time. Recommend a PET scan for further evaluation. Also check CBC CMP; myeloma panel; kappa/ lambda light chain ratio in approximately 3 months. I spoke to pathologist.   # Dyspoietic changes and all 3 cell lines noted on the bone marrow biopsy- question MDS versus reactive. Check Z61 folic acid; zinc copper- at next visit. Await fish panel.  # Mild thrombocytopenia platelet count of 115- question related to dyspoietic changes versus others. Monitor for now.   # Fatigue unclear etiology- patient is being worked up for cardiac causes; low testosterone- on testosterone supplementation.Defer to endocrinology.   # I tried reaching  patient's daughter - Mitchelle  Carman Ching- [phone]; cannot leave a message.   # Patient follow-up with me in approximately 3 months; PET scan labs a week prior.     Cammie Sickle, MD 07/05/2015 12:43 PM   .

## 2015-07-06 ENCOUNTER — Ambulatory Visit: Payer: Commercial Managed Care - PPO | Admitting: Internal Medicine

## 2015-07-13 LAB — CHROMOSOME ANALYSIS, BONE MARROW

## 2015-07-13 LAB — TISSUE HYBRIDIZATION (BONE MARROW)-NCBH

## 2015-07-29 ENCOUNTER — Telehealth: Payer: Self-pay | Admitting: *Deleted

## 2015-07-29 ENCOUNTER — Inpatient Hospital Stay: Payer: Commercial Managed Care - PPO | Attending: Internal Medicine

## 2015-07-29 DIAGNOSIS — R410 Disorientation, unspecified: Secondary | ICD-10-CM

## 2015-07-29 DIAGNOSIS — C9000 Multiple myeloma not having achieved remission: Secondary | ICD-10-CM | POA: Insufficient documentation

## 2015-07-29 DIAGNOSIS — D472 Monoclonal gammopathy: Secondary | ICD-10-CM

## 2015-07-29 LAB — CBC WITH DIFFERENTIAL/PLATELET
BASOS ABS: 0 10*3/uL (ref 0–0.1)
BASOS PCT: 0 %
Eosinophils Absolute: 0.2 10*3/uL (ref 0–0.7)
Eosinophils Relative: 3 %
HEMATOCRIT: 37.2 % — AB (ref 40.0–52.0)
HEMOGLOBIN: 12.2 g/dL — AB (ref 13.0–18.0)
LYMPHS PCT: 23 %
Lymphs Abs: 1.4 10*3/uL (ref 1.0–3.6)
MCH: 27.9 pg (ref 26.0–34.0)
MCHC: 32.8 g/dL (ref 32.0–36.0)
MCV: 85 fL (ref 80.0–100.0)
MONO ABS: 0.7 10*3/uL (ref 0.2–1.0)
MONOS PCT: 12 %
NEUTROS ABS: 3.9 10*3/uL (ref 1.4–6.5)
NEUTROS PCT: 62 %
Platelets: 96 10*3/uL — ABNORMAL LOW (ref 150–440)
RBC: 4.38 MIL/uL — ABNORMAL LOW (ref 4.40–5.90)
RDW: 25.6 % — AB (ref 11.5–14.5)
WBC: 6.3 10*3/uL (ref 3.8–10.6)

## 2015-07-29 LAB — COMPREHENSIVE METABOLIC PANEL
ALBUMIN: 3.8 g/dL (ref 3.5–5.0)
ALK PHOS: 84 U/L (ref 38–126)
ALT: 26 U/L (ref 17–63)
AST: 24 U/L (ref 15–41)
Anion gap: 4 — ABNORMAL LOW (ref 5–15)
BILIRUBIN TOTAL: 0.6 mg/dL (ref 0.3–1.2)
BUN: 20 mg/dL (ref 6–20)
CALCIUM: 8.5 mg/dL — AB (ref 8.9–10.3)
CO2: 24 mmol/L (ref 22–32)
CREATININE: 0.98 mg/dL (ref 0.61–1.24)
Chloride: 106 mmol/L (ref 101–111)
GFR calc Af Amer: >60 mL/min (ref >60)
GLUCOSE: 111 mg/dL — AB (ref 65–99)
POTASSIUM: 3.8 mmol/L (ref 3.5–5.1)
Sodium: 134 mmol/L — ABNORMAL LOW (ref 135–145)
TOTAL PROTEIN: 7.4 g/dL (ref 6.5–8.1)

## 2015-07-29 LAB — MAGNESIUM: MAGNESIUM: 2.4 mg/dL (ref 1.7–2.4)

## 2015-07-29 NOTE — Telephone Encounter (Signed)
Received call from daughter, Sharyn Lull, who reports patient is more confused and not ambulating as well as he normally does. Concerned that may be related to Calcium level or something else. She feels that something has definitely changed. Her contact number is [phone].

## 2015-07-29 NOTE — Telephone Encounter (Signed)
Per Dr Bradd Canary, come in for CBC, CMP, MG+. I called Adele Barthel back and got her VM and left message to return my call

## 2015-07-29 NOTE — Addendum Note (Signed)
Addended by: Betti Cruz on: 07/29/2015 10:36 AM   Modules accepted: Orders

## 2015-07-29 NOTE — Telephone Encounter (Signed)
Sharyn Lull returned call and agrees to appt at 115 for lab

## 2015-08-01 ENCOUNTER — Telehealth: Payer: Self-pay | Admitting: *Deleted

## 2015-08-01 NOTE — Telephone Encounter (Signed)
Called patient's daughter, Sharyn Lull, and LVM that labs were okay with the exception of calcium and platelets, however, they should not change the patient's symptoms. MD recommends patient follow up with PCP.  Also will have scheduling call within the week to reschedule PET scan closer than 3 months out.

## 2015-08-01 NOTE — Telephone Encounter (Signed)
-----   Message from Cammie Sickle, MD sent at 07/29/2015  5:06 PM EDT ----- Please inform patient's daughter- labs look okay [exceptcalcium 8.5; platelets 96;stable hemoglobin at 12]however this should not patient's changes symptoms. Recommend follow up with PCP; and also recommend getting the PET scan sooner [previously this was ordered for 3 months]; and then follow-up with me. Thx

## 2015-08-01 NOTE — Telephone Encounter (Signed)
msg sent to [facility] scheduling to move the md/pet scan apts up within the next week.

## 2015-08-04 ENCOUNTER — Telehealth: Payer: Self-pay | Admitting: *Deleted

## 2015-08-04 NOTE — Telephone Encounter (Signed)
RN Spoke with Dr. Rogue Bussing - He signed the new order for pet scan-whole body.  Christina in prior auth cancer ctr dept actively working on approval for new whole body pet scan.

## 2015-08-04 NOTE — Telephone Encounter (Signed)
Patient is scheduled for skull base to thigh.  Due to multiple myeloma dx patient will have to have whole body scan.  Ronald Vincent has changed it in [facility] and will need MD to electronically signed by end of day.  Also wants to know if patient is authorized for whole body scan.  I have put in a call to [facility] to check on this.  Awaiting answer.

## 2015-08-05 ENCOUNTER — Inpatient Hospital Stay: Payer: Commercial Managed Care - PPO

## 2015-08-05 ENCOUNTER — Ambulatory Visit
Admission: RE | Admit: 2015-08-05 | Discharge: 2015-08-05 | Disposition: A | Discharge status: Home / Self Care | Payer: Commercial Managed Care - PPO | Source: Ambulatory Visit | Attending: Internal Medicine | Admitting: Internal Medicine

## 2015-08-05 DIAGNOSIS — C9000 Multiple myeloma not having achieved remission: Secondary | ICD-10-CM | POA: Diagnosis not present

## 2015-08-05 DIAGNOSIS — D649 Anemia, unspecified: Secondary | ICD-10-CM | POA: Diagnosis present

## 2015-08-05 DIAGNOSIS — D472 Monoclonal gammopathy: Secondary | ICD-10-CM

## 2015-08-05 LAB — COMPREHENSIVE METABOLIC PANEL
ALK PHOS: 78 U/L (ref 38–126)
ALT: 28 U/L (ref 17–63)
AST: 36 U/L (ref 15–41)
Albumin: 4.1 g/dL (ref 3.5–5.0)
Anion gap: 5 (ref 5–15)
BUN: 30 mg/dL — AB (ref 6–20)
CALCIUM: 8.6 mg/dL — AB (ref 8.9–10.3)
CHLORIDE: 105 mmol/L (ref 101–111)
CO2: 23 mmol/L (ref 22–32)
CREATININE: 1.11 mg/dL (ref 0.61–1.24)
Glucose, Bld: 81 mg/dL (ref 65–99)
Potassium: 4.2 mmol/L (ref 3.5–5.1)
Sodium: 133 mmol/L — ABNORMAL LOW (ref 135–145)
Total Bilirubin: 0.7 mg/dL (ref 0.3–1.2)
Total Protein: 7.8 g/dL (ref 6.5–8.1)

## 2015-08-05 LAB — LACTATE DEHYDROGENASE: LDH: 164 U/L (ref 98–192)

## 2015-08-05 LAB — FOLATE: FOLATE: 46 ng/mL (ref >5.9)

## 2015-08-05 LAB — CBC WITH DIFFERENTIAL/PLATELET
BASOS ABS: 0 10*3/uL (ref 0–0.1)
Basophils Relative: 0 %
EOS PCT: 1 %
Eosinophils Absolute: 0.1 10*3/uL (ref 0–0.7)
HEMATOCRIT: 38.2 % — AB (ref 40.0–52.0)
HEMOGLOBIN: 12.7 g/dL — AB (ref 13.0–18.0)
Lymphocytes Relative: 21 %
Lymphs Abs: 1.5 10*3/uL (ref 1.0–3.6)
MCH: 27.8 pg (ref 26.0–34.0)
MCHC: 33.1 g/dL (ref 32.0–36.0)
MCV: 84.1 fL (ref 80.0–100.0)
Monocytes Absolute: 0.6 10*3/uL (ref 0.2–1.0)
Monocytes Relative: 9 %
NEUTROS ABS: 5.2 10*3/uL (ref 1.4–6.5)
NEUTROS PCT: 69 %
PLATELETS: 107 10*3/uL — AB (ref 150–440)
RBC: 4.54 MIL/uL (ref 4.40–5.90)
RDW: 25 % — ABNORMAL HIGH (ref 11.5–14.5)
WBC: 7.5 10*3/uL (ref 3.8–10.6)

## 2015-08-05 LAB — VITAMIN B12: VITAMIN B 12: 560 pg/mL (ref 180–914)

## 2015-08-05 LAB — GLUCOSE, CAPILLARY: Glucose-Capillary: 70 mg/dL (ref 65–99)

## 2015-08-05 MED ORDER — FLUDEOXYGLUCOSE F - 18 (FDG) INJECTION
12.6000 | Freq: Once | INTRAVENOUS | Status: AC | PRN
  Administered 2015-08-05: 12.6 via INTRAVENOUS

## 2015-08-08 ENCOUNTER — Inpatient Hospital Stay: Payer: Commercial Managed Care - PPO | Attending: Internal Medicine | Admitting: Internal Medicine

## 2015-08-08 VITALS — BP 123/80 | HR 57 | Temp 97.2°F | Resp 18 | Wt 165.6 lb

## 2015-08-08 DIAGNOSIS — D696 Thrombocytopenia, unspecified: Secondary | ICD-10-CM | POA: Insufficient documentation

## 2015-08-08 DIAGNOSIS — G4730 Sleep apnea, unspecified: Secondary | ICD-10-CM | POA: Diagnosis not present

## 2015-08-08 DIAGNOSIS — D472 Monoclonal gammopathy: Secondary | ICD-10-CM

## 2015-08-08 DIAGNOSIS — C9000 Multiple myeloma not having achieved remission: Secondary | ICD-10-CM | POA: Diagnosis present

## 2015-08-08 DIAGNOSIS — E893 Postprocedural hypopituitarism: Secondary | ICD-10-CM | POA: Diagnosis not present

## 2015-08-08 DIAGNOSIS — K8020 Calculus of gallbladder without cholecystitis without obstruction: Secondary | ICD-10-CM | POA: Insufficient documentation

## 2015-08-08 DIAGNOSIS — Z79899 Other long term (current) drug therapy: Secondary | ICD-10-CM | POA: Insufficient documentation

## 2015-08-08 DIAGNOSIS — R0602 Shortness of breath: Secondary | ICD-10-CM | POA: Insufficient documentation

## 2015-08-08 DIAGNOSIS — I2510 Atherosclerotic heart disease of native coronary artery without angina pectoris: Secondary | ICD-10-CM | POA: Insufficient documentation

## 2015-08-08 LAB — MULTIPLE MYELOMA PANEL, SERUM
ALBUMIN SERPL ELPH-MCNC: 3.5 g/dL (ref 2.9–4.4)
ALPHA 1: 0.2 g/dL (ref 0.0–0.4)
ALPHA2 GLOB SERPL ELPH-MCNC: 0.7 g/dL (ref 0.4–1.0)
Albumin/Glob SerPl: 1.1 (ref 0.7–1.7)
B-GLOBULIN SERPL ELPH-MCNC: 0.8 g/dL (ref 0.7–1.3)
Gamma Glob SerPl Elph-Mcnc: 1.9 g/dL — ABNORMAL HIGH (ref 0.4–1.8)
Globulin, Total: 3.5 g/dL (ref 2.2–3.9)
IGG (IMMUNOGLOBIN G), SERUM: 2184 mg/dL — AB (ref 700–1600)
IgA: 17 mg/dL — ABNORMAL LOW (ref 61–437)
IgM, Serum: 25 mg/dL (ref 15–143)
M PROTEIN SERPL ELPH-MCNC: 1.8 g/dL — AB
TOTAL PROTEIN ELP: 7 g/dL (ref 6.0–8.5)

## 2015-08-08 NOTE — Progress Notes (Signed)
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 FISH- Normal; June 26th 2017- PET- NED  # May 2017- BMBx- Dyspoiesis of three cell lines- ? MDS vs reactive; FISH- Normal**  # Panhypopit- sec benign pit tumor resection [0865 x2; [facility]; s/p RT]; Low testosterone [ May 2017- 212]; hypothyroidism/adrenal insuff [Dr. 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  male pleasant Caucasian patient noted to have elevated M protein/thrombocytopenia-summarized above is here to review the results of PET scan.  He continues to complain to have chronic fatigue; not significantly improved. Chronic mild Shortness of breath on exertion. Denies any swelling in the legs or weight loss or tingling and numbness. Denies any chest pain. Patient is fairly active for his age. Accompanied by his wife and daughter.  ROS: A complete 10 point review of system is done which is negative except mentioned above in history of present illness  MEDICAL HISTORY:  Past Medical History  Diagnosis Date  . 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 Sleep apnea   . Thyroid dysfunction   . Urinary frequency   . Multiple myeloma ([place])     smuldering multiple myeloma    SURGICAL HISTORY: Past Surgical History  Procedure Laterality Date  . Appendectomy    . Transphenoidal / transnasal hypophysectomy / resection pituitary tumor  [id number]  . Cataracts Right 2014  . Eye surgery Bilateral 2014    to correct double vision  . Tonsillectomy      as a child  . Bone marrow biopsy  06/29/15    SOCIAL  HISTORY: No smoking or alcohol. [place]. His daughter is a Marine scientist in the [employer]. He lives with his wife at home. Social History   Social History  . Marital Status: Married    Spouse Name: N/A  . Number of Children: N/A  . Years of Education: N/A   Occupational History  . Not on file.   Social History Main Topics  . Smoking status: Never Smoker   . Smokeless tobacco: Never Used  . Alcohol Use: No  . Drug Use: No  . Sexual Activity: Not on file   Other Topics Concern  . Not on file   Social History Narrative    FAMILY HISTORY:No family history of multiple myeloma. No family history on file.  ALLERGIES:  has No Known Allergies.  MEDICATIONS:  Current Outpatient Prescriptions  Medication Sig Dispense Refill  . alendronate (FOSAMAX) 70 MG tablet Take by mouth.    . B Complex Vitamins (VITAMIN-B COMPLEX) TABS Take by mouth.    . B-D 3CC LUER-LOK SYR 18GX1-1/2 18G X 1-1/2" 3 ML MISC USE 1 SYRINGE ONCE EVERY 2 WEEKS  2  . Cyanocobalamin (B-12) 2500 MCG TABS Take by mouth.    . Flaxseed, Linseed, (FLAXSEED OIL) 1000 MG CAPS Take by mouth.    . Glucosamine Sulfate 500 MG TABS Take by mouth.    . hydrocortisone (CORTEF) 10 MG tablet     . levothyroxine (SYNTHROID, LEVOTHROID) 150 MCG tablet     .  Multiple Vitamin (MULTI-VITAMINS) TABS Take by mouth.    Marland Kitchen NEEDLE, DISP, 18 G (BD ECLIPSE SHIELDED NEEDLE) 18G X 1-1/2" MISC     . Syringe, Disposable, (2-3CC SYRINGE) 3 ML MISC     . SYRINGE-NEEDLE, DISP, 3 ML (B-D 3CC LUER-LOK SYR 18GX1-1/2) 18G X 1-1/2" 3 ML MISC     . testosterone cypionate (DEPOTESTOSTERONE CYPIONATE) 200 MG/ML injection      No current facility-administered medications for this visit.      Marland Kitchen  PHYSICAL EXAMINATION: ECOG PERFORMANCE STATUS: 0 - Asymptomatic  Filed Vitals:   08/08/15 1556  BP: 123/80  Pulse: 57  Temp: 97.2 F (36.2 C)  Resp: 18   Filed Weights   08/08/15 1556  Weight: 165 lb 9.1  oz (75.1 kg)    GENERAL: Well-nourished well-developed; Alert, no distress and comfortable. Accompanied by his wife/daughter.  EYES: no pallor or icterus OROPHARYNX: no thrush or ulceration; good dentition  NECK: supple, no masses felt; flexion deformity of the neck LYMPH:  no palpable lymphadenopathy in the cervical, axillary or inguinal regions LUNGS: clear to auscultation and  No wheeze or crackles HEART/CVS: regular rate & rhythm and no murmurs; No lower extremity edema ABDOMEN: abdomen soft, non-tender and normal bowel sounds Musculoskeletal:no cyanosis of digits and no clubbing; kyphosis / scoliosis- chest wall deformity PSYCH: alert & oriented x 3 with fluent speech NEURO: no focal motor/sensory deficits SKIN:  no rashes or significant lesions  LABORATORY DATA:  I have reviewed the data as listed Lab Results  Component Value Date   WBC 7.5 08/05/2015   HGB 12.7* 08/05/2015   HCT 38.2* 08/05/2015   MCV 84.1 08/05/2015   PLT 107* 08/05/2015    Recent Labs  07/29/15 1330 08/05/15 1102  NA 134* 133*  K 3.8 4.2  CL 106 105  CO2 24 23  GLUCOSE 111* 81  BUN 20 30*  CREATININE 0.98 1.11  CALCIUM 8.5* 8.6*  GFRNONAA >60 >60  GFRAA >60 >60  PROT 7.4 7.8  ALBUMIN 3.8 4.1  AST 24 36  ALT 26 28  ALKPHOS 84 78  BILITOT 0.6 0.7    RADIOGRAPHIC STUDIES: I have personally reviewed the radiological images as listed and agreed with the findings in the report. Nm Pet Image Initial (pi) Whole Body  08/05/2015  CLINICAL DATA:  Initial treatment strategy for multiple myeloma. EXAM: NUCLEAR MEDICINE PET WHOLE BODY TECHNIQUE: 12.6 mCi F-18 FDG was injected intravenously. Full-ring PET imaging was performed from the vertex to the feet after the radiotracer. CT data was obtained and used for attenuation correction and anatomic localization. FASTING BLOOD GLUCOSE:  Value:  70 mg/dl COMPARISON:  Skeletal survey 06/21/2015 FINDINGS: Head/Neck: No abnormal metabolic activity in the head  or neck. No lymphadenopathy. Chest: No hypermetabolic mediastinal or hilar nodes. No suspicious pulmonary nodules on the CT scan. Abdomen/Pelvis: No abnormal hypermetabolic activity within the liver, pancreas, adrenal glands, or spleen. No hypermetabolic lymph nodes in the abdomen or pelvis. Several gallstones in lumen of the gallbladder. Atherosclerotic calcification aorta. Skeleton: No focal hypermetabolic activity to suggest skeletal metastasis. Extremities: No hypermetabolic activity to suggest metastasis. IMPRESSION: 1. No abnormal metabolic activity within the skeleton to localize multiple myeloma. 2. No evidence of skeletal or soft tissue plasmacytoma. Electronically Signed   By: Suzy Bouchard M.D.   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 Skeletal survey negative for any lytic lesions. PET scan negative for any active myeloma. Patient is asymptomatic except for chronic fatigue; I would recommend surveillance at this time.   # I discussed the options of clinical trial for high-risk smoldering myeloma tertiary care institute; patient family not interested.   # For now I recommend checking CBC CMP; myeloma panel; kappa/ lambda light chain ratio in approximately 3 months.   # Dyspoietic changes and all 3 cell lines noted on the bone marrow biopsy- question MDS versus reactive. Fish panel negative.  # Mild thrombocytopenia platelet count of 115- question related to dyspoietic changes versus others.Currently asymptomatic. If patient's counts drop- trial of steroids could be given for possible ITP   Monitor for now.      I reviewed the images myself and with the patient and family in detail. A copy of this report was given. The above plan of care was discussed with the patient and family in detail.  #  Patient is recommended follow up in 3 months/ labs 1 week prior.     Cammie Sickle, MD 08/08/2015 4:49 PM   .

## 2015-08-08 NOTE — Progress Notes (Signed)
Patient here today or PET results.  Patient states over the weekend he was having leg pain.  Patient accompanied by daughter today who states she thinks her dad got dehydrated last week after mowing and weedeating.  States he has some exertional SOB.  States he feels sleepy during the day.

## 2015-08-08 NOTE — Assessment & Plan Note (Signed)
#  Smoldering MULTIPLE MYELOMA- M spike 1.8 g/dL/ IgG kappa light- patient has mild anemia hemoglobin 12 platelets 115; no renal insufficiency/hypercalcemia. Skeletal survey negative for any lytic lesions. PET scan negative for any active myeloma. Patient is asymptomatic except for chronic fatigue; I would recommend surveillance at this time.   # I discussed the options of clinical trial for high-risk smoldering myeloma tertiary care institute; patient family not interested.   # For now I recommend checking CBC CMP; myeloma panel; kappa/ lambda light chain ratio in approximately 3 months.   # Dyspoietic changes and all 3 cell lines noted on the bone marrow biopsy- question MDS versus reactive. Fish panel negative.  # Mild thrombocytopenia platelet count of 115- question related to dyspoietic changes versus others.Currently asymptomatic. If patient's counts drop- trial of steroids could be given for possible ITP   Monitor for now.

## 2015-08-09 LAB — KAPPA/LAMBDA LIGHT CHAINS
KAPPA FREE LGHT CHN: 109.5 mg/L — AB (ref 3.3–19.4)
Kappa, lambda light chain ratio: 17.66 — ABNORMAL HIGH (ref 0.26–1.65)
Lambda free light chains: 6.2 mg/L (ref 5.7–26.3)

## 2015-08-09 LAB — COPPER, SERUM: Copper: 105 ug/dL (ref 72–166)

## 2015-08-09 LAB — ZINC: Zinc: 99 ug/dL (ref 56–134)

## 2015-09-20 ENCOUNTER — Other Ambulatory Visit: Payer: Commercial Managed Care - PPO

## 2015-09-20 ENCOUNTER — Ambulatory Visit: Payer: Commercial Managed Care - PPO

## 2015-09-27 ENCOUNTER — Ambulatory Visit: Payer: Commercial Managed Care - PPO | Admitting: Internal Medicine

## 2015-10-05 ENCOUNTER — Other Ambulatory Visit: Payer: Commercial Managed Care - PPO

## 2015-10-11 ENCOUNTER — Ambulatory Visit: Payer: Commercial Managed Care - PPO | Admitting: Internal Medicine

## 2015-10-28 ENCOUNTER — Telehealth: Payer: Self-pay | Admitting: *Deleted

## 2015-10-28 NOTE — Telephone Encounter (Signed)
Per VO Dr Rogue Bussing no abx needed from hematology standpoint. If he is having tooth pulled, needs CBC first. Have left VM for wife to call me back

## 2015-10-28 NOTE — Telephone Encounter (Signed)
Having dental work on Monday and is asking if he needs antibiotics before he goes. Please advise

## 2015-10-28 NOTE — Telephone Encounter (Signed)
I spoke with Mrs [place] and advised that no abx needed, He is only having teeth cleaned

## 2015-10-31 ENCOUNTER — Other Ambulatory Visit: Payer: Commercial Managed Care - PPO

## 2015-11-01 ENCOUNTER — Other Ambulatory Visit: Payer: Commercial Managed Care - PPO

## 2015-11-07 ENCOUNTER — Ambulatory Visit: Payer: Commercial Managed Care - PPO | Admitting: Oncology

## 2015-11-08 ENCOUNTER — Ambulatory Visit: Payer: Commercial Managed Care - PPO | Admitting: Internal Medicine

## 2015-11-08 ENCOUNTER — Inpatient Hospital Stay: Payer: Commercial Managed Care - PPO | Attending: Internal Medicine

## 2015-11-08 DIAGNOSIS — Z79899 Other long term (current) drug therapy: Secondary | ICD-10-CM | POA: Diagnosis not present

## 2015-11-08 DIAGNOSIS — R0602 Shortness of breath: Secondary | ICD-10-CM | POA: Diagnosis not present

## 2015-11-08 DIAGNOSIS — G4730 Sleep apnea, unspecified: Secondary | ICD-10-CM | POA: Diagnosis not present

## 2015-11-08 DIAGNOSIS — C9001 Multiple myeloma in remission: Secondary | ICD-10-CM | POA: Diagnosis not present

## 2015-11-08 DIAGNOSIS — E291 Testicular hypofunction: Secondary | ICD-10-CM | POA: Insufficient documentation

## 2015-11-08 DIAGNOSIS — E237 Disorder of pituitary gland, unspecified: Secondary | ICD-10-CM | POA: Diagnosis not present

## 2015-11-08 DIAGNOSIS — E2740 Unspecified adrenocortical insufficiency: Secondary | ICD-10-CM | POA: Insufficient documentation

## 2015-11-08 DIAGNOSIS — R5383 Other fatigue: Secondary | ICD-10-CM | POA: Insufficient documentation

## 2015-11-08 DIAGNOSIS — E039 Hypothyroidism, unspecified: Secondary | ICD-10-CM | POA: Diagnosis not present

## 2015-11-08 DIAGNOSIS — C9000 Multiple myeloma not having achieved remission: Secondary | ICD-10-CM

## 2015-11-08 DIAGNOSIS — D472 Monoclonal gammopathy: Secondary | ICD-10-CM

## 2015-11-08 DIAGNOSIS — R350 Frequency of micturition: Secondary | ICD-10-CM | POA: Diagnosis not present

## 2015-11-08 LAB — CBC WITH DIFFERENTIAL/PLATELET
BASOS ABS: 0 10*3/uL (ref 0–0.1)
Basophils Relative: 0 %
EOS ABS: 0.3 10*3/uL (ref 0–0.7)
EOS PCT: 3 %
HCT: 35.3 % — ABNORMAL LOW (ref 40.0–52.0)
Hemoglobin: 11.4 g/dL — ABNORMAL LOW (ref 13.0–18.0)
Lymphocytes Relative: 15 %
Lymphs Abs: 1.2 10*3/uL (ref 1.0–3.6)
MCH: 27.1 pg (ref 26.0–34.0)
MCHC: 32.3 g/dL (ref 32.0–36.0)
MCV: 84.1 fL (ref 80.0–100.0)
Monocytes Absolute: 0.7 10*3/uL (ref 0.2–1.0)
Monocytes Relative: 9 %
NEUTROS PCT: 73 %
Neutro Abs: 5.5 10*3/uL (ref 1.4–6.5)
Platelets: 129 10*3/uL — ABNORMAL LOW (ref 150–440)
RBC: 4.19 MIL/uL — AB (ref 4.40–5.90)
RDW: 24.6 % — AB (ref 11.5–14.5)
WBC: 7.6 10*3/uL (ref 3.8–10.6)

## 2015-11-08 LAB — COMPREHENSIVE METABOLIC PANEL
ALBUMIN: 3.7 g/dL (ref 3.5–5.0)
ALK PHOS: 93 U/L (ref 38–126)
ALT: 28 U/L (ref 17–63)
ANION GAP: 8 (ref 5–15)
AST: 28 U/L (ref 15–41)
BILIRUBIN TOTAL: 0.7 mg/dL (ref 0.3–1.2)
BUN: 20 mg/dL (ref 6–20)
CALCIUM: 8.5 mg/dL — AB (ref 8.9–10.3)
CO2: 23 mmol/L (ref 22–32)
Chloride: 103 mmol/L (ref 101–111)
Creatinine, Ser: 0.88 mg/dL (ref 0.61–1.24)
GFR calc non Af Amer: >60 mL/min (ref >60)
Glucose, Bld: 99 mg/dL (ref 65–99)
POTASSIUM: 3.8 mmol/L (ref 3.5–5.1)
Sodium: 134 mmol/L — ABNORMAL LOW (ref 135–145)
TOTAL PROTEIN: 7.8 g/dL (ref 6.5–8.1)

## 2015-11-08 LAB — LACTATE DEHYDROGENASE: LDH: 233 U/L — AB (ref 98–192)

## 2015-11-09 LAB — KAPPA/LAMBDA LIGHT CHAINS
Kappa free light chain: 113.7 mg/L — ABNORMAL HIGH (ref 3.3–19.4)
Kappa, lambda light chain ratio: 19.6 — ABNORMAL HIGH (ref 0.26–1.65)
LAMDA FREE LIGHT CHAINS: 5.8 mg/L (ref 5.7–26.3)

## 2015-11-10 LAB — MULTIPLE MYELOMA PANEL, SERUM
ALBUMIN SERPL ELPH-MCNC: 3.7 g/dL (ref 2.9–4.4)
Albumin/Glob SerPl: 1 (ref 0.7–1.7)
Alpha 1: 0.3 g/dL (ref 0.0–0.4)
Alpha2 Glob SerPl Elph-Mcnc: 0.7 g/dL (ref 0.4–1.0)
B-Globulin SerPl Elph-Mcnc: 0.8 g/dL (ref 0.7–1.3)
GAMMA GLOB SERPL ELPH-MCNC: 2 g/dL — AB (ref 0.4–1.8)
GLOBULIN, TOTAL: 3.8 g/dL (ref 2.2–3.9)
IGA: 18 mg/dL — AB (ref 61–437)
IGM, SERUM: 25 mg/dL (ref 15–143)
IgG (Immunoglobin G), Serum: 2297 mg/dL — ABNORMAL HIGH (ref 700–1600)
M Protein SerPl Elph-Mcnc: 1.9 g/dL — ABNORMAL HIGH
Total Protein ELP: 7.5 g/dL (ref 6.0–8.5)

## 2015-11-15 ENCOUNTER — Encounter: Payer: Self-pay | Admitting: Internal Medicine

## 2015-11-15 ENCOUNTER — Inpatient Hospital Stay (HOSPITAL_BASED_OUTPATIENT_CLINIC_OR_DEPARTMENT_OTHER): Payer: Commercial Managed Care - PPO | Admitting: Internal Medicine

## 2015-11-15 VITALS — BP 108/68 | HR 75 | Temp 97.1°F | Resp 18 | Ht 65.0 in | Wt 169.8 lb

## 2015-11-15 DIAGNOSIS — G4730 Sleep apnea, unspecified: Secondary | ICD-10-CM

## 2015-11-15 DIAGNOSIS — D472 Monoclonal gammopathy: Secondary | ICD-10-CM

## 2015-11-15 DIAGNOSIS — R350 Frequency of micturition: Secondary | ICD-10-CM

## 2015-11-15 DIAGNOSIS — Z79899 Other long term (current) drug therapy: Secondary | ICD-10-CM

## 2015-11-15 DIAGNOSIS — R5383 Other fatigue: Secondary | ICD-10-CM | POA: Diagnosis not present

## 2015-11-15 DIAGNOSIS — E039 Hypothyroidism, unspecified: Secondary | ICD-10-CM

## 2015-11-15 DIAGNOSIS — R0602 Shortness of breath: Secondary | ICD-10-CM

## 2015-11-15 DIAGNOSIS — C9001 Multiple myeloma in remission: Secondary | ICD-10-CM

## 2015-11-15 DIAGNOSIS — E2740 Unspecified adrenocortical insufficiency: Secondary | ICD-10-CM | POA: Diagnosis not present

## 2015-11-15 DIAGNOSIS — E237 Disorder of pituitary gland, unspecified: Secondary | ICD-10-CM

## 2015-11-15 DIAGNOSIS — C9000 Multiple myeloma not having achieved remission: Secondary | ICD-10-CM

## 2015-11-15 DIAGNOSIS — E291 Testicular hypofunction: Secondary | ICD-10-CM

## 2015-11-15 NOTE — Assessment & Plan Note (Addendum)
#  Smoldering MULTIPLE MYELOMA- M spike 1.8 g/dL/ IgG kappa light- patient has mild anemia hemoglobin 12 platelets 115; no renal insufficiency/hypercalcemia. Skeletal survey negative for any lytic lesions. PET scan negative for any active myeloma.  # Most recent labs- hemoglobin 11.5; platelets 129 normal white count and normal kidney number and calcium; M protein 1.9 g/dL; otherwise improved kappa light chain.  Patient is asymptomatic;  except for chronic fatigue; I would recommend surveillance at this time. They do understand that patient will likely need treatment in the next few months.   # For now I recommend checking CBC CMP; myeloma panel; kappa/ lambda light chain ratio in approximately 3 months.   # Dyspoietic changes and all 3 cell lines noted on the bone marrow biopsy- question MDS versus reactive. Fish panel negative.  # Recommend follow-up in 3 months labs few Days prior.

## 2015-11-15 NOTE — Progress Notes (Signed)
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 FISH- Normal; June 26th 2017- PET- NED  # May 2017- BMBx- Dyspoiesis of three cell lines- ? MDS vs reactive; FISH- Normal**  # Panhypopit- sec benign pit tumor resection [6222 x2; [facility]; s/p RT]; Low testosterone [ May 2017- 212]; hypothyroidism/adrenal insuff [Dr. 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  male pleasant Caucasian patient With a smoldering myeloma is here for follow-up/to review the labs  He continues to complain to have chronic fatigue; not significantly improved. Chronic mild Shortness of breath on exertion. Denies any swelling in the legs or weight loss or tingling and numbness. Denies any chest pain. Patient is fairly active for his age. Accompanied by his wife.   ROS: A complete 10 point review of system is done which is negative except mentioned above in history of present illness  MEDICAL HISTORY:  Past Medical History:  Diagnosis Date  . Multiple myeloma ([facility])    smuldering multiple myeloma  . Pituitary tumor   . Sleep apnea   . Thyroid dysfunction   . Urinary frequency     SURGICAL HISTORY: Past Surgical History:  Procedure Laterality Date  . APPENDECTOMY    . BONE MARROW BIOPSY  06/29/15  . cataracts Right 2014  . EYE SURGERY Bilateral 2014   to correct double vision  . TONSILLECTOMY     as a child  . TRANSPHENOIDAL / TRANSNASAL HYPOPHYSECTOMY / RESECTION PITUITARY TUMOR  [phone]    SOCIAL HISTORY: No smoking or alcohol. [place] here. His daughter is a Marine scientist in the [place] [employer]. He lives with his wife at home. Social History   Social History  . Marital status: Married    Spouse name: N/A  . Number of children: N/A  . Years of education: N/A   Occupational History  . Not on file.   Social History Main Topics  . Smoking status: Never Smoker  . Smokeless tobacco: Never Used  . Alcohol use No  . Drug use: No  . Sexual activity: Not on file   Other Topics Concern  . Not on file   Social History Narrative  . No narrative on file    FAMILY HISTORY:No family history of multiple myeloma. History reviewed. No pertinent family history.  ALLERGIES:  has No Known Allergies.  MEDICATIONS:  Current Outpatient Prescriptions  Medication Sig Dispense Refill  . alendronate (FOSAMAX) 70 MG tablet Take by mouth.    . B-D 3CC LUER-LOK SYR 18GX1-1/2 18G X 1-1/2" 3 ML MISC USE 1 SYRINGE ONCE EVERY 2 WEEKS  2  . Cyanocobalamin (B-12) 2500 MCG TABS Take by mouth.    . Flaxseed, Linseed, (FLAXSEED OIL) 1000 MG CAPS Take by mouth.    . Glucosamine Sulfate 500 MG TABS Take by mouth.    . hydrocortisone (CORTEF) 10 MG tablet     . levothyroxine (SYNTHROID, LEVOTHROID) 150 MCG tablet     . Multiple Vitamin (MULTI-VITAMINS) TABS Take by mouth.    Marland Kitchen  NEEDLE, DISP, 18 G (BD ECLIPSE SHIELDED NEEDLE) 18G X 1-1/2" MISC     . Syringe, Disposable, (2-3CC SYRINGE) 3 ML MISC     . SYRINGE-NEEDLE, DISP, 3 ML (B-D 3CC LUER-LOK SYR 18GX1-1/2) 18G X 1-1/2" 3 ML MISC     . testosterone cypionate (DEPOTESTOSTERONE CYPIONATE) 200 MG/ML injection      No current facility-administered medications for this visit.       Marland Kitchen  PHYSICAL EXAMINATION: ECOG PERFORMANCE STATUS: 0 - Asymptomatic  Vitals:   11/15/15 1539  BP: 108/68  Pulse: 75  Resp: 18  Temp: 97.1 F (36.2 C)   Filed Weights   11/15/15 1539  Weight: 169 lb 12.1 oz (77 kg)    GENERAL: Well-nourished well-developed; Alert, no distress and  comfortable. Accompanied by his wife/daughter.  EYES: no pallor or icterus OROPHARYNX: no thrush or ulceration; good dentition  NECK: supple, no masses felt; flexion deformity of the neck LYMPH:  no palpable lymphadenopathy in the cervical, axillary or inguinal regions LUNGS: clear to auscultation and  No wheeze or crackles HEART/CVS: regular rate & rhythm and no murmurs; No lower extremity edema ABDOMEN: abdomen soft, non-tender and normal bowel sounds Musculoskeletal:no cyanosis of digits and no clubbing; kyphosis / scoliosis- chest wall deformity PSYCH: alert & oriented x 3 with fluent speech NEURO: no focal motor/sensory deficits SKIN:  no rashes or significant lesions  LABORATORY DATA:  I have reviewed the data as listed Lab Results  Component Value Date   WBC 7.6 11/08/2015   HGB 11.4 (L) 11/08/2015   HCT 35.3 (L) 11/08/2015   MCV 84.1 11/08/2015   PLT 129 (L) 11/08/2015    Recent Labs  07/29/15 1330 08/05/15 1102 11/08/15 1200  NA 134* 133* 134*  K 3.8 4.2 3.8  CL 106 105 103  CO2 24 23 23   GLUCOSE 111* 81 99  BUN 20 30* 20  CREATININE 0.98 1.11 0.88  CALCIUM 8.5* 8.6* 8.5*  GFRNONAA >60 >60 >60  GFRAA >60 >60 >60  PROT 7.4 7.8 7.8  ALBUMIN 3.8 4.1 3.7  AST 24 36 28  ALT 26 28 28   ALKPHOS 84 78 93  BILITOT 0.6 0.7 0.7    RADIOGRAPHIC STUDIES: I have personally reviewed the radiological images as listed and agreed with the findings in the report. No results found.  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 Skeletal survey negative for any lytic lesions. PET scan negative for any active myeloma.  # Most recent labs- hemoglobin 11.5; platelets 129 normal white count and normal kidney number and calcium; M protein 1.9 g/dL; otherwise improved kappa light chain.  Patient is asymptomatic;  except for  chronic fatigue; I would recommend surveillance at this time. They do understand that patient will likely need treatment in the next few months.   # For now I recommend checking CBC CMP; myeloma panel; kappa/ lambda light chain ratio in approximately 3 months.   # Dyspoietic changes and all 3 cell lines noted on the bone marrow biopsy- question MDS versus reactive. Fish panel negative.  # Recommend follow-up in 3 months labs few Days prior.       Cammie Sickle, MD 11/15/2015 5:20 PM   .

## 2015-11-15 NOTE — Progress Notes (Signed)
No concerns.  2 1/2 weeks was hit into the gate by leeting the cows out.  [address] and hit ground and has bruise on back .

## 2016-02-13 ENCOUNTER — Inpatient Hospital Stay: Payer: Medicare Other | Attending: Internal Medicine

## 2016-02-13 DIAGNOSIS — E2740 Unspecified adrenocortical insufficiency: Secondary | ICD-10-CM | POA: Diagnosis not present

## 2016-02-13 DIAGNOSIS — C9001 Multiple myeloma in remission: Secondary | ICD-10-CM | POA: Insufficient documentation

## 2016-02-13 DIAGNOSIS — G4730 Sleep apnea, unspecified: Secondary | ICD-10-CM | POA: Diagnosis not present

## 2016-02-13 DIAGNOSIS — E230 Hypopituitarism: Secondary | ICD-10-CM | POA: Insufficient documentation

## 2016-02-13 DIAGNOSIS — E039 Hypothyroidism, unspecified: Secondary | ICD-10-CM | POA: Diagnosis not present

## 2016-02-13 DIAGNOSIS — D472 Monoclonal gammopathy: Secondary | ICD-10-CM

## 2016-02-13 DIAGNOSIS — E291 Testicular hypofunction: Secondary | ICD-10-CM | POA: Insufficient documentation

## 2016-02-13 DIAGNOSIS — C9000 Multiple myeloma not having achieved remission: Secondary | ICD-10-CM

## 2016-02-13 DIAGNOSIS — D696 Thrombocytopenia, unspecified: Secondary | ICD-10-CM | POA: Diagnosis not present

## 2016-02-13 DIAGNOSIS — R5383 Other fatigue: Secondary | ICD-10-CM | POA: Insufficient documentation

## 2016-02-13 LAB — COMPREHENSIVE METABOLIC PANEL
ALT: 18 U/L (ref 17–63)
AST: 22 U/L (ref 15–41)
Albumin: 3.9 g/dL (ref 3.5–5.0)
Alkaline Phosphatase: 70 U/L (ref 38–126)
Anion gap: 7 (ref 5–15)
BILIRUBIN TOTAL: 0.6 mg/dL (ref 0.3–1.2)
BUN: 16 mg/dL (ref 6–20)
CO2: 23 mmol/L (ref 22–32)
CREATININE: 1.01 mg/dL (ref 0.61–1.24)
Calcium: 8.8 mg/dL — ABNORMAL LOW (ref 8.9–10.3)
Chloride: 103 mmol/L (ref 101–111)
GFR calc Af Amer: >60 mL/min (ref >60)
Glucose, Bld: 89 mg/dL (ref 65–99)
Potassium: 3.7 mmol/L (ref 3.5–5.1)
Sodium: 133 mmol/L — ABNORMAL LOW (ref 135–145)
TOTAL PROTEIN: 7.7 g/dL (ref 6.5–8.1)

## 2016-02-13 LAB — CBC WITH DIFFERENTIAL/PLATELET
BASOS ABS: 0 10*3/uL (ref 0–0.1)
Basophils Relative: 0 %
Eosinophils Absolute: 0.3 10*3/uL (ref 0–0.7)
Eosinophils Relative: 5 %
HEMATOCRIT: 37.5 % — AB (ref 40.0–52.0)
Hemoglobin: 11.8 g/dL — ABNORMAL LOW (ref 13.0–18.0)
LYMPHS ABS: 0.8 10*3/uL — AB (ref 1.0–3.6)
LYMPHS PCT: 14 %
MCH: 26.1 pg (ref 26.0–34.0)
MCHC: 31.5 g/dL — ABNORMAL LOW (ref 32.0–36.0)
MCV: 83 fL (ref 80.0–100.0)
MONO ABS: 0.9 10*3/uL (ref 0.2–1.0)
Monocytes Relative: 15 %
NEUTROS ABS: 4.1 10*3/uL (ref 1.4–6.5)
Neutrophils Relative %: 66 %
Platelets: 82 10*3/uL — ABNORMAL LOW (ref 150–440)
RBC: 4.51 MIL/uL (ref 4.40–5.90)
RDW: 23.8 % — AB (ref 11.5–14.5)
WBC: 6.2 10*3/uL (ref 3.8–10.6)

## 2016-02-13 LAB — LACTATE DEHYDROGENASE: LDH: 164 U/L (ref 98–192)

## 2016-02-14 ENCOUNTER — Other Ambulatory Visit: Payer: Commercial Managed Care - PPO

## 2016-02-14 LAB — MULTIPLE MYELOMA PANEL, SERUM
ALBUMIN SERPL ELPH-MCNC: 3.7 g/dL (ref 2.9–4.4)
ALBUMIN/GLOB SERPL: 1.1 (ref 0.7–1.7)
ALPHA 1: 0.2 g/dL (ref 0.0–0.4)
ALPHA2 GLOB SERPL ELPH-MCNC: 0.7 g/dL (ref 0.4–1.0)
B-Globulin SerPl Elph-Mcnc: 0.7 g/dL (ref 0.7–1.3)
Gamma Glob SerPl Elph-Mcnc: 2 g/dL — ABNORMAL HIGH (ref 0.4–1.8)
Globulin, Total: 3.6 g/dL (ref 2.2–3.9)
IGM, SERUM: 30 mg/dL (ref 15–143)
IgA: 19 mg/dL — ABNORMAL LOW (ref 61–437)
IgG (Immunoglobin G), Serum: 2220 mg/dL — ABNORMAL HIGH (ref 700–1600)
M PROTEIN SERPL ELPH-MCNC: 1.5 g/dL — AB
PDF: 0
Total Protein ELP: 7.3 g/dL (ref 6.0–8.5)

## 2016-02-14 LAB — KAPPA/LAMBDA LIGHT CHAINS
KAPPA FREE LGHT CHN: 111 mg/L — AB (ref 3.3–19.4)
Kappa, lambda light chain ratio: 11.68 — ABNORMAL HIGH (ref 0.26–1.65)
Lambda free light chains: 9.5 mg/L (ref 5.7–26.3)

## 2016-02-21 ENCOUNTER — Inpatient Hospital Stay (HOSPITAL_BASED_OUTPATIENT_CLINIC_OR_DEPARTMENT_OTHER): Payer: Medicare Other | Admitting: Internal Medicine

## 2016-02-21 ENCOUNTER — Ambulatory Visit: Payer: Commercial Managed Care - PPO | Admitting: Internal Medicine

## 2016-02-21 VITALS — BP 95/60 | HR 71 | Temp 97.5°F | Wt 165.7 lb

## 2016-02-21 DIAGNOSIS — E039 Hypothyroidism, unspecified: Secondary | ICD-10-CM

## 2016-02-21 DIAGNOSIS — G4730 Sleep apnea, unspecified: Secondary | ICD-10-CM

## 2016-02-21 DIAGNOSIS — D472 Monoclonal gammopathy: Secondary | ICD-10-CM

## 2016-02-21 DIAGNOSIS — E2740 Unspecified adrenocortical insufficiency: Secondary | ICD-10-CM

## 2016-02-21 DIAGNOSIS — E230 Hypopituitarism: Secondary | ICD-10-CM

## 2016-02-21 DIAGNOSIS — C9001 Multiple myeloma in remission: Secondary | ICD-10-CM

## 2016-02-21 DIAGNOSIS — R5383 Other fatigue: Secondary | ICD-10-CM

## 2016-02-21 DIAGNOSIS — D696 Thrombocytopenia, unspecified: Secondary | ICD-10-CM | POA: Diagnosis not present

## 2016-02-21 DIAGNOSIS — C9000 Multiple myeloma not having achieved remission: Secondary | ICD-10-CM

## 2016-02-21 DIAGNOSIS — E291 Testicular hypofunction: Secondary | ICD-10-CM

## 2016-02-21 NOTE — Progress Notes (Signed)
Patient here today for follow up.   

## 2016-02-21 NOTE — Progress Notes (Signed)
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 FISH- Normal; June 26th 2017- PET- NED  # May 2017- BMBx- Dyspoiesis of three cell lines- ? MDS vs reactive; FISH- Normal**  # Panhypopit- sec benign pit tumor resection [1470 x2; [facility]; s/p RT]; Low testosterone [ May 2017- 212]; hypothyroidism/adrenal insuff [Dr. 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  male pleasant Caucasian patient With a smoldering myeloma is here for follow-up/to review the labs.  Patient continues to chronic fatigue. Not any better or worse. Denies any unusual shortness of breath or chest pain or cough. Denies any unusual bone pain. He continues to be fairly active for his age. Accompanied by his wife.   ROS: A complete 10 point review of system is done which is negative except mentioned above in history of present illness  MEDICAL HISTORY:  Past Medical History:  Diagnosis Date  . Multiple myeloma ([facility])    smuldering multiple myeloma  . Pituitary tumor   . Sleep apnea   . Thyroid dysfunction   . Urinary frequency     SURGICAL HISTORY: Past Surgical History:  Procedure Laterality Date  . APPENDECTOMY    . BONE MARROW BIOPSY  06/29/15  . cataracts Right 2014  . EYE SURGERY Bilateral 2014   to correct double vision  . TONSILLECTOMY     as a child  . TRANSPHENOIDAL / TRANSNASAL HYPOPHYSECTOMY / RESECTION PITUITARY TUMOR  [phone]    SOCIAL HISTORY: No smoking or alcohol. [place]. His daughter is a Marine scientist  in the [place] [facility]. He lives with his wife at home. Social History   Social History  . Marital status: Married    Spouse name: N/A  . Number of children: N/A  . Years of education: N/A   Occupational History  . Not on file.   Social History Main Topics  . Smoking status: Never Smoker  . Smokeless tobacco: Never Used  . Alcohol use No  . Drug use: No  . Sexual activity: Not on file   Other Topics Concern  . Not on file   Social History Narrative  . No narrative on file    FAMILY HISTORY:No family history of multiple myeloma. No family history on file.  ALLERGIES:  has No Known Allergies.  MEDICATIONS:  Current Outpatient Prescriptions  Medication Sig Dispense Refill  . alendronate (FOSAMAX) 70 MG tablet Take 70 mg by mouth every 7 (seven) days.    . Cyanocobalamin (B-12) 2500 MCG TABS Take by mouth.    . Flaxseed, Linseed, (FLAXSEED OIL) 1000 MG CAPS Take by mouth.    . Glucosamine Sulfate 500 MG TABS Take by mouth.    . hydrocortisone (CORTEF) 10 MG tablet Take 10 mg by mouth 2 (two) times daily.     Marland Kitchen levothyroxine (SYNTHROID, LEVOTHROID) 150 MCG tablet Take 150 mcg by mouth daily before breakfast.     . Multiple Vitamin (MULTI-VITAMINS) TABS Take by mouth.    . tadalafil (CIALIS) 10 MG tablet Take  one tablet 30-45 min prior to anticipated sexual activity.    Marland Kitchen testosterone cypionate (DEPOTESTOSTERONE CYPIONATE) 200 MG/ML injection Inject 0.4 mLs into the muscle every 14 (fourteen) days.    . B-D 3CC LUER-LOK SYR 18GX1-1/2 18G X 1-1/2" 3 ML MISC USE 1 SYRINGE ONCE EVERY 2 WEEKS  2  . NEEDLE, DISP, 18 G (BD ECLIPSE SHIELDED NEEDLE) 18G X 1-1/2" MISC     . Syringe, Disposable, (2-3CC SYRINGE) 3 ML MISC     . SYRINGE-NEEDLE, DISP, 3 ML (B-D 3CC LUER-LOK SYR 18GX1-1/2) 18G X 1-1/2" 3 ML MISC      No current facility-administered medications for this visit.       Marland Kitchen  PHYSICAL EXAMINATION: ECOG PERFORMANCE STATUS: 0 - Asymptomatic  Vitals:   02/21/16 1202  BP:  95/60  Pulse: 71  Temp: 97.5 F (36.4 C)   Filed Weights   02/21/16 1202  Weight: 165 lb 10.8 oz (75.1 kg)    GENERAL: Well-nourished well-developed; Alert, no distress and comfortable. Accompanied by his daughter.  EYES: no pallor or icterus OROPHARYNX: no thrush or ulceration; good dentition  NECK: supple, no masses felt; flexion deformity of the neck LYMPH:  no palpable lymphadenopathy in the cervical, axillary or inguinal regions LUNGS: clear to auscultation and  No wheeze or crackles HEART/CVS: regular rate & rhythm and no murmurs; No lower extremity edema ABDOMEN: abdomen soft, non-tender and normal bowel sounds Musculoskeletal:no cyanosis of digits and no clubbing; kyphosis / scoliosis- chest wall deformity PSYCH: alert & oriented x 3 with fluent speech NEURO: no focal motor/sensory deficits SKIN:  no rashes or significant lesions  LABORATORY DATA:  I have reviewed the data as listed Lab Results  Component Value Date   WBC 6.2 02/13/2016   HGB 11.8 (L) 02/13/2016   HCT 37.5 (L) 02/13/2016   MCV 83.0 02/13/2016   PLT 82 (L) 02/13/2016    Recent Labs  08/05/15 1102 11/08/15 1200 02/13/16 1054  NA 133* 134* 133*  K 4.2 3.8 3.7  CL 105 103 103  CO2 23 23 23   GLUCOSE 81 99 89  BUN 30* 20 16  CREATININE 1.11 0.88 1.01  CALCIUM 8.6* 8.5* 8.8*  GFRNONAA >60 >60 >60  GFRAA >60 >60 >60  PROT 7.8 7.8 7.7  ALBUMIN 4.1 3.7 3.9  AST 36 28 22  ALT 28 28 18   ALKPHOS 78 93 70  BILITOT 0.7 0.7 0.6    RADIOGRAPHIC STUDIES: I have personally reviewed the radiological images as listed and agreed with the findings in the report. No results found.  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   # Thrombocytopenia- 82 to 130s- unclear etiology-asymptomatic.. Question ITP versus MDS [Dyspoietic  changes noted in all 3 cell lines noted on the bone marrow biopsy- question MDS versus reactive. Fish panel negative.] versus myeloma. If this continues to get worse I would recommend a trial of steroids.   # Recommend follow-up in 4 months labs few Days prior.  # Reviewed the labs with the patient and family detail.      Cammie Sickle, MD 02/21/2016 12:28 PM   .

## 2016-02-21 NOTE — Assessment & Plan Note (Signed)
#  Smoldering MULTIPLE MYELOMA- M spike 1.5 g/dL/ IgG kappa lamda light chains- patient has mild anemia hemoglobin 11 platelets 82-100s; NO renal insufficiency/hypercalcemia.   # Thrombocytopenia- 82 to 130s- unclear etiology-asymptomatic.. Question ITP versus MDS [Dyspoietic changes noted in all 3 cell lines noted on the bone marrow biopsy- question MDS versus reactive. Fish panel negative.] versus myeloma. If this continues to get worse I would recommend a trial of steroids.   # Recommend follow-up in 4 months labs few Days prior.  # Reviewed the labs with the patient and family detail.

## 2016-06-19 ENCOUNTER — Inpatient Hospital Stay: Payer: Medicare Other | Attending: Internal Medicine

## 2016-06-19 ENCOUNTER — Other Ambulatory Visit: Payer: Commercial Managed Care - PPO

## 2016-06-19 DIAGNOSIS — R5383 Other fatigue: Secondary | ICD-10-CM | POA: Insufficient documentation

## 2016-06-19 DIAGNOSIS — Z923 Personal history of irradiation: Secondary | ICD-10-CM | POA: Insufficient documentation

## 2016-06-19 DIAGNOSIS — D696 Thrombocytopenia, unspecified: Secondary | ICD-10-CM | POA: Diagnosis not present

## 2016-06-19 DIAGNOSIS — Z79899 Other long term (current) drug therapy: Secondary | ICD-10-CM | POA: Diagnosis not present

## 2016-06-19 DIAGNOSIS — E039 Hypothyroidism, unspecified: Secondary | ICD-10-CM | POA: Insufficient documentation

## 2016-06-19 DIAGNOSIS — R350 Frequency of micturition: Secondary | ICD-10-CM | POA: Diagnosis not present

## 2016-06-19 DIAGNOSIS — D649 Anemia, unspecified: Secondary | ICD-10-CM | POA: Insufficient documentation

## 2016-06-19 DIAGNOSIS — E230 Hypopituitarism: Secondary | ICD-10-CM | POA: Diagnosis not present

## 2016-06-19 DIAGNOSIS — C9000 Multiple myeloma not having achieved remission: Secondary | ICD-10-CM | POA: Diagnosis not present

## 2016-06-19 DIAGNOSIS — G4730 Sleep apnea, unspecified: Secondary | ICD-10-CM | POA: Diagnosis not present

## 2016-06-19 DIAGNOSIS — D472 Monoclonal gammopathy: Secondary | ICD-10-CM

## 2016-06-19 LAB — COMPREHENSIVE METABOLIC PANEL
ALT: 27 U/L (ref 17–63)
ANION GAP: 4 — AB (ref 5–15)
AST: 27 U/L (ref 15–41)
Albumin: 3.9 g/dL (ref 3.5–5.0)
Alkaline Phosphatase: 81 U/L (ref 38–126)
BILIRUBIN TOTAL: 0.3 mg/dL (ref 0.3–1.2)
BUN: 17 mg/dL (ref 6–20)
CHLORIDE: 103 mmol/L (ref 101–111)
CO2: 25 mmol/L (ref 22–32)
Calcium: 8.4 mg/dL — ABNORMAL LOW (ref 8.9–10.3)
Creatinine, Ser: 0.98 mg/dL (ref 0.61–1.24)
GFR calc Af Amer: >60 mL/min (ref >60)
GFR calc non Af Amer: >60 mL/min (ref >60)
Glucose, Bld: 85 mg/dL (ref 65–99)
POTASSIUM: 4 mmol/L (ref 3.5–5.1)
Sodium: 132 mmol/L — ABNORMAL LOW (ref 135–145)
TOTAL PROTEIN: 8.1 g/dL (ref 6.5–8.1)

## 2016-06-19 LAB — CBC WITH DIFFERENTIAL/PLATELET
Basophils Absolute: 0 10*3/uL (ref 0–0.1)
Basophils Relative: 0 %
Eosinophils Absolute: 0.4 10*3/uL (ref 0–0.7)
Eosinophils Relative: 4 %
HEMATOCRIT: 36.3 % — AB (ref 40.0–52.0)
HEMOGLOBIN: 11.6 g/dL — AB (ref 13.0–18.0)
LYMPHS ABS: 1.2 10*3/uL (ref 1.0–3.6)
Lymphocytes Relative: 13 %
MCH: 26.2 pg (ref 26.0–34.0)
MCHC: 31.8 g/dL — AB (ref 32.0–36.0)
MCV: 82.5 fL (ref 80.0–100.0)
MONOS PCT: 15 %
Monocytes Absolute: 1.3 10*3/uL — ABNORMAL HIGH (ref 0.2–1.0)
NEUTROS ABS: 6.1 10*3/uL (ref 1.4–6.5)
NEUTROS PCT: 68 %
PLATELETS: 73 10*3/uL — AB (ref 150–440)
RBC: 4.4 MIL/uL (ref 4.40–5.90)
RDW: 24 % — AB (ref 11.5–14.5)
WBC: 9 10*3/uL (ref 3.8–10.6)

## 2016-06-20 LAB — KAPPA/LAMBDA LIGHT CHAINS
KAPPA FREE LGHT CHN: 142.1 mg/L — AB (ref 3.3–19.4)
Kappa, lambda light chain ratio: 18.22 — ABNORMAL HIGH (ref 0.26–1.65)
LAMDA FREE LIGHT CHAINS: 7.8 mg/L (ref 5.7–26.3)

## 2016-06-26 ENCOUNTER — Ambulatory Visit: Payer: Commercial Managed Care - PPO | Admitting: Internal Medicine

## 2016-06-26 ENCOUNTER — Inpatient Hospital Stay (HOSPITAL_BASED_OUTPATIENT_CLINIC_OR_DEPARTMENT_OTHER): Payer: Medicare Other | Admitting: Internal Medicine

## 2016-06-26 VITALS — HR 71 | Temp 97.6°F | Resp 18 | Wt 171.2 lb

## 2016-06-26 DIAGNOSIS — D472 Monoclonal gammopathy: Secondary | ICD-10-CM

## 2016-06-26 DIAGNOSIS — E230 Hypopituitarism: Secondary | ICD-10-CM | POA: Diagnosis not present

## 2016-06-26 DIAGNOSIS — Z923 Personal history of irradiation: Secondary | ICD-10-CM

## 2016-06-26 DIAGNOSIS — Z79899 Other long term (current) drug therapy: Secondary | ICD-10-CM

## 2016-06-26 DIAGNOSIS — D696 Thrombocytopenia, unspecified: Secondary | ICD-10-CM | POA: Diagnosis not present

## 2016-06-26 DIAGNOSIS — D649 Anemia, unspecified: Secondary | ICD-10-CM

## 2016-06-26 DIAGNOSIS — C9000 Multiple myeloma not having achieved remission: Secondary | ICD-10-CM | POA: Diagnosis not present

## 2016-06-26 DIAGNOSIS — E039 Hypothyroidism, unspecified: Secondary | ICD-10-CM | POA: Diagnosis not present

## 2016-06-26 DIAGNOSIS — R350 Frequency of micturition: Secondary | ICD-10-CM

## 2016-06-26 DIAGNOSIS — G4730 Sleep apnea, unspecified: Secondary | ICD-10-CM

## 2016-06-26 DIAGNOSIS — R5383 Other fatigue: Secondary | ICD-10-CM

## 2016-06-26 NOTE — Assessment & Plan Note (Addendum)
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  patient has mild anemia hemoglobin 11 platelets 70-100s; NO renal insufficiency/hypercalcemia. PET scan 2017 negative for any skeletal lesions.  # Mild anemia- Hb- 11-12; not cause of pt's fatigue.   # Thrombocytopenia- 71 today/slow trend down. Unclear etiology-Asymptomatic.. Question ITP versus MDS [Dyspoietic changes noted in all 3 cell lines noted on the bone marrow biopsy- question MDS versus reactive. Fish panel negative.] versus myeloma. I would recommend a trial of steroids-platelets gets worse.  # Recommend follow-up in 4 months labs few Days prior. Reviewed the labs with the patient and family detail.  CC;Dr.Anderson.

## 2016-06-26 NOTE — Progress Notes (Signed)
[place] NOTE  Patient Care Team: Kirk Ruths, MD as PCP - General (Internal Medicine)  CHIEF COMPLAINTS/PURPOSE OF CONSULTATION:   Oncology History   # MAY 2017-SMOLDERING MULTIPLE MYELOMA [ BMBx- 15% plasma cell; M- spike 1.8gm/dl; K/L= 113/6= 17 [IgGKappa-IFE] [creat-1.0/ca- 8.8]; Hb12/platelets- 115. FISH- Normal; June 26th 2017- PET- NED  # May 2017- BMBx- Dyspoiesis of three cell lines- ? 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  male pleasant Caucasian patient With a smoldering myeloma is here for follow-up/to review the labs.  Patient denies any admissions hospital. Denies any new onset of bone pain Patient continues to chronic fatigue. Not any better or worse. Denies any unusual shortness of breath or chest pain or cough He continues to be fairly active for his age. Accompanied by his wife. Denies any tingling or numbness.  ROS: A complete 10 point review of system is done which is negative except mentioned above in history of present illness  MEDICAL HISTORY:  Past Medical History:  Diagnosis Date  . Multiple myeloma ([facility])    smuldering multiple myeloma  . Pituitary tumor   . Sleep apnea   . Thyroid dysfunction   . Urinary frequency     SURGICAL HISTORY: Past Surgical History:  Procedure Laterality Date  . APPENDECTOMY    . BONE MARROW BIOPSY  06/29/15  . cataracts Right 2014  . EYE SURGERY Bilateral 2014   to correct double vision  . TONSILLECTOMY     as a child  . TRANSPHENOIDAL / TRANSNASAL HYPOPHYSECTOMY / RESECTION PITUITARY TUMOR  [phone]    SOCIAL HISTORY: No smoking or alcohol.  [place] here. His daughter is a Marine scientist in the [place] clinic. He lives with his wife at home. Social History   Social History  . Marital status: Married    Spouse name: N/A  . Number of children: N/A  . Years of education: N/A   Occupational History  . Not on file.   Social History Main Topics  . Smoking status: Never Smoker  . Smokeless tobacco: Never Used  . Alcohol use No  . Drug use: No  . Sexual activity: Not on file   Other Topics Concern  . Not on file   Social History Narrative  . No narrative on file    FAMILY HISTORY:No family history of multiple myeloma. No family history on file.  ALLERGIES:  has No Known Allergies.  MEDICATIONS:  Current Outpatient Prescriptions  Medication Sig Dispense Refill  . B Complex Vitamins (VITAMIN-B COMPLEX) TABS Take by mouth.    . B-D 3CC LUER-LOK SYR 18GX1-1/2 18G X 1-1/2" 3 ML MISC USE 1 SYRINGE ONCE EVERY 2 WEEKS  2  . Cyanocobalamin (B-12) 2500 MCG TABS Take by mouth.    . Flaxseed, Linseed, (FLAXSEED OIL) 1000 MG CAPS Take by mouth.    . Glucosamine Sulfate 500 MG TABS Take by mouth.    . hydrocortisone (CORTEF) 10 MG tablet Take 10 mg by mouth 2 (two) times daily.     Marland Kitchen levothyroxine (SYNTHROID, LEVOTHROID) 150 MCG tablet Take 150  mcg by mouth daily before breakfast.     . Multiple Vitamin (MULTI-VITAMINS) TABS Take by mouth.    Marland Kitchen NEEDLE, DISP, 18 G (BD ECLIPSE SHIELDED NEEDLE) 18G X 1-1/2" MISC     . Syringe, Disposable, (2-3CC SYRINGE) 3 ML MISC     . SYRINGE-NEEDLE, DISP, 3 ML (B-D 3CC LUER-LOK SYR 18GX1-1/2) 18G X 1-1/2" 3 ML MISC     . tadalafil (CIALIS) 10 MG tablet Take one tablet 30-45 min prior to anticipated sexual activity.    Marland Kitchen testosterone cypionate (DEPOTESTOSTERONE CYPIONATE) 200 MG/ML injection Inject 0.4 mLs into the muscle every 14 (fourteen) days.    Marland Kitchen alendronate (FOSAMAX) 70 MG tablet Take 70 mg by mouth every 7 (seven) days.    Marland Kitchen triamcinolone cream (KENALOG)  0.1 % APPLY TWICE DAILY TO AFFECTED ITCHY AREAS ON LEGS AS NEEDED  1   No current facility-administered medications for this visit.       Marland Kitchen  PHYSICAL EXAMINATION: ECOG PERFORMANCE STATUS: 0 - Asymptomatic  Vitals:   06/26/16 1429  Pulse: 71  Resp: 18  Temp: 97.6 F (36.4 C)   Filed Weights   06/26/16 1429  Weight: 171 lb 3 oz (77.7 kg)    GENERAL: Well-nourished well-developed; Alert, no distress and comfortable. Accompanied by his Wife EYES: no pallor or icterus OROPHARYNX: no thrush or ulceration; good dentition  NECK: supple, no masses felt; flexion deformity of the neck LYMPH:  no palpable lymphadenopathy in the cervical, axillary or inguinal regions LUNGS: clear to auscultation and  No wheeze or crackles HEART/CVS: regular rate & rhythm and no murmurs; No lower extremity edema ABDOMEN: abdomen soft, non-tender and normal bowel sounds Musculoskeletal:no cyanosis of digits and no clubbing; kyphosis / scoliosis- chest wall deformity PSYCH: alert & oriented x 3 with fluent speech NEURO: no focal motor/sensory deficits SKIN:  no rashes or significant lesions  LABORATORY DATA:  I have reviewed the data as listed Lab Results  Component Value Date   WBC 9.0 06/19/2016   HGB 11.6 (L) 06/19/2016   HCT 36.3 (L) 06/19/2016   MCV 82.5 06/19/2016   PLT 73 (L) 06/19/2016    Recent Labs  11/08/15 1200 02/13/16 1054 06/19/16 1056  NA 134* 133* 132*  K 3.8 3.7 4.0  CL 103 103 103  CO2 23 23 25   GLUCOSE 99 89 85  BUN 20 16 17   CREATININE 0.88 1.01 0.98  CALCIUM 8.5* 8.8* 8.4*  GFRNONAA >60 >60 >60  GFRAA >60 >60 >60  PROT 7.8 7.7 8.1  ALBUMIN 3.7 3.9 3.9  AST 28 22 27   ALT 28 18 27   ALKPHOS 93 70 81  BILITOT 0.7 0.6 0.3   Results for NIKITAS, DAVTYAN (MRN [id number]) as of 06/26/2016 18:01  Ref. Range 06/29/2015 09:20 06/29/2015 09:48 06/29/2015 16:14 07/29/2015 13:30 08/05/2015 11:02 08/05/2015 11:41 08/05/2015 14:15 11/08/2015 12:00 02/13/2016 10:54 02/13/2016 10:55  06/19/2016 10:56  M Protein SerPl Elph-Mcnc Latest Ref Range: Not Observed g/dL     1.8 (H)   1.9 (H)  1.5 (H)    Results for RAYN, ENDERSON (MRN [id number]) as of 06/26/2016 18:01  Ref. Range 06/29/2015 09:20 06/29/2015 09:48 06/29/2015 16:14 07/29/2015 13:30 08/05/2015 11:02 08/05/2015 11:41 08/05/2015 14:15 11/08/2015 12:00 02/13/2016 10:54 02/13/2016 10:55 06/19/2016 10:56  Kappa free light chain Latest Ref Range: 3.3 - 19.4 mg/L     109.5 (H)   113.7 (H) 111.0 (H)  142.1 (H)  Lamda free light chains Latest Ref Range: 5.7 - 26.3 mg/L  6.2   5.8 9.5  7.8  Kappa, lamda light chain ratio Latest Ref Range: 0.26 - 1.65      17.66 (H)   19.60 (H) 11.68 (H)  18.22 (H)   RADIOGRAPHIC STUDIES: I have personally reviewed the radiological images as listed and agreed with the findings in the report. No results found.  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  patient has mild anemia hemoglobin 11 platelets 70-100s; NO renal insufficiency/hypercalcemia. PET scan 2017 negative for any skeletal lesions.  # Mild anemia- Hb- 11-12; not cause of pt's fatigue.   # Thrombocytopenia- 71 today/slow trend down. Unclear etiology-Asymptomatic.. Question ITP versus MDS [Dyspoietic changes noted in all 3 cell lines noted on the bone marrow biopsy- question MDS versus reactive. Fish panel negative.] versus myeloma. I would recommend a trial of steroids-platelets gets worse.  # Recommend follow-up in 4 months labs few Days prior. Reviewed the labs with the patient and family detail.  CC;Dr.Anderson.      Cammie Sickle, MD 06/26/2016 6:04 PM   .

## 2016-06-26 NOTE — Progress Notes (Signed)
Here for follow up

## 2016-07-30 ENCOUNTER — Other Ambulatory Visit: Payer: Self-pay | Admitting: Internal Medicine

## 2016-07-30 NOTE — Progress Notes (Signed)
FYI-  Reviewed the second opinion note from [facility]/Dr.Tuchman. Pt recommend repeat BMBx. Appreciate the consult/evaluation. Follow up as planned in sep 2018/or sooner based on recommendations from [facility]

## 2016-10-12 ENCOUNTER — Telehealth: Payer: Self-pay | Admitting: *Deleted

## 2016-10-12 NOTE — Telephone Encounter (Signed)
-----   Message from Secundino Ginger sent at 10/12/2016  1:48 PM EDT ----- Regarding: patient FYI// this pt wife called and cancelled his upcoming appt in Sep. She said his primary dr is sending him to [facility] to a dr the specializes in his illness.

## 2016-10-16 ENCOUNTER — Other Ambulatory Visit: Payer: Medicare Other

## 2016-10-23 ENCOUNTER — Ambulatory Visit: Payer: Medicare Other | Admitting: Internal Medicine

## 2017-03-30 IMAGING — CT CT BIOPSY
1 series · 2 of 4 positions shown, 5 images · non-contrast
Comparison: none

INDICATION: 69-year-old male with anemia, monoclonal gammopathy and
thrombocytopenia. Evaluate for multiple myeloma.

[Series 4: (hospital) 6.0 b30f · axial · 1.16mm/px · z∈[-110,-110]mm · 2 of 4 slices shown, 5 images]
[im 2/4  soft-tissue]
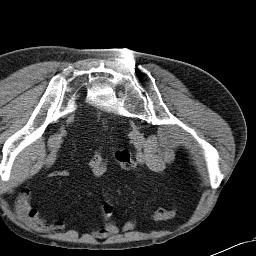
[im 2/4  lung]
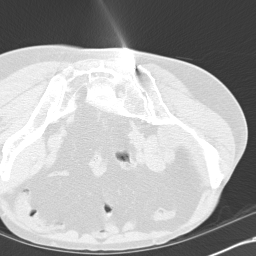
[im 2/4  bone]
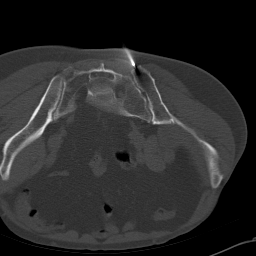
[im 3/4  soft-tissue]
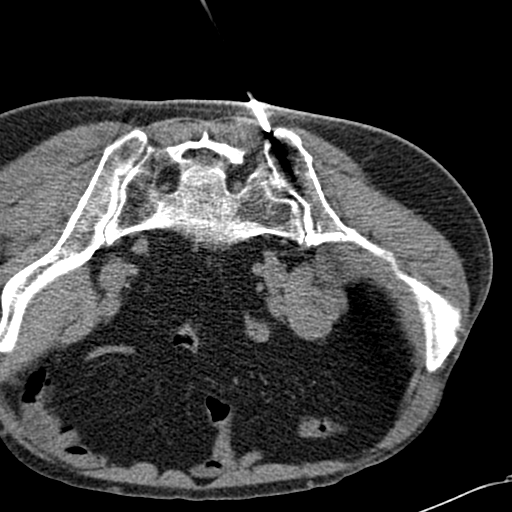
[im 3/4  lung]
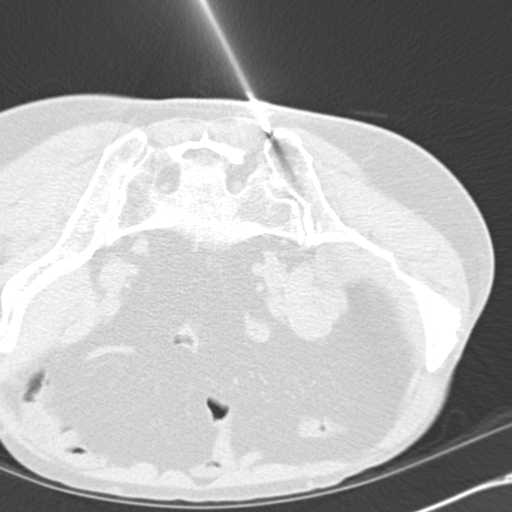

[2 of 4 positions shown; findings below may reference images not displayed]

EXAM:
CT GUIDED BONE MARROW ASPIRATION AND CORE BIOPSY

MEDICATIONS:
None.

ANESTHESIA/SEDATION:
Moderate (conscious) sedation was employed during this procedure. A
total of 2 milligrams versed and 100 micrograms fentanyl were
administered intravenously. The patient's level of consciousness and
vital signs were monitored continuously by radiology nursing
throughout the procedure under my direct supervision.

Total monitored sedation time: 10 minutes

FLUOROSCOPY TIME:  None

COMPLICATIONS:
None immediate.

Estimated blood loss: <25 mL

PROCEDURE:
Informed written consent was obtained from the patient after a
thorough discussion of the procedural risks, benefits and
alternatives. All questions were addressed. Maximal Sterile Barrier
Technique was utilized including caps, mask, sterile gowns, sterile
gloves, sterile drape, hand hygiene and skin antiseptic. A timeout
was performed prior to the initiation of the procedure.

The patient was positioned prone and non-contrast localization CT
was performed of the pelvis to demonstrate the iliac marrow spaces.

Maximal barrier sterile technique utilized including caps, mask,
sterile gowns, sterile gloves, large sterile drape, hand hygiene,
and betadine prep.

Under sterile conditions and local anesthesia, an 11 gauge coaxial
bone biopsy needle was advanced into the right iliac marrow space.
Needle position was confirmed with CT imaging. Initially, bone
marrow aspiration was performed. Next, the 11 gauge outer cannula
was utilized to obtain a right iliac bone marrow core biopsy. Needle
was removed. Hemostasis was obtained with compression. The patient
tolerated the procedure well. Samples were prepared with the
cytotechnologist.
IMPRESSION: Technically successful CT-guided bone marrow biopsy of the right
iliac bone.

## 2017-05-13 ENCOUNTER — Other Ambulatory Visit: Payer: Self-pay | Admitting: Internal Medicine

## 2017-05-13 DIAGNOSIS — Z8639 Personal history of other endocrine, nutritional and metabolic disease: Secondary | ICD-10-CM

## 2017-05-13 DIAGNOSIS — R296 Repeated falls: Secondary | ICD-10-CM

## 2017-05-13 DIAGNOSIS — Z86018 Personal history of other benign neoplasm: Secondary | ICD-10-CM

## 2018-07-07 DEATH — deceased
